# Patient Record
Sex: Female | Born: 1945
Health system: Southern US, Community
[De-identification: ages and names within clinical notes are randomized; demographics above are authoritative.]

## PROBLEM LIST (undated history)

## (undated) DIAGNOSIS — E785 Hyperlipidemia, unspecified: Secondary | ICD-10-CM

## (undated) DIAGNOSIS — I2510 Atherosclerotic heart disease of native coronary artery without angina pectoris: Secondary | ICD-10-CM

## (undated) DIAGNOSIS — M810 Age-related osteoporosis without current pathological fracture: Secondary | ICD-10-CM

## (undated) DIAGNOSIS — Z950 Presence of cardiac pacemaker: Secondary | ICD-10-CM

## (undated) DIAGNOSIS — H269 Unspecified cataract: Secondary | ICD-10-CM

## (undated) DIAGNOSIS — T7840XA Allergy, unspecified, initial encounter: Secondary | ICD-10-CM

## (undated) DIAGNOSIS — I10 Essential (primary) hypertension: Secondary | ICD-10-CM

## (undated) DIAGNOSIS — I442 Atrioventricular block, complete: Secondary | ICD-10-CM

## (undated) DIAGNOSIS — R7309 Other abnormal glucose: Secondary | ICD-10-CM

## (undated) HISTORY — DX: Allergy, unspecified, initial encounter: T78.40XA

## (undated) HISTORY — DX: Unspecified cataract: H26.9

## (undated) HISTORY — DX: Age-related osteoporosis without current pathological fracture: M81.0

---
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
## 1998-03-12 DIAGNOSIS — I10 Essential (primary) hypertension: Secondary | ICD-10-CM

## 1998-03-12 HISTORY — DX: Essential (primary) hypertension: I10

## 2007-03-13 HISTORY — PX: OTHER SURGICAL HISTORY: SHX169

## 2008-03-12 DIAGNOSIS — E785 Hyperlipidemia, unspecified: Secondary | ICD-10-CM

## 2008-03-12 HISTORY — DX: Hyperlipidemia, unspecified: E78.5

## 2009-03-12 HISTORY — PX: CHOLECYSTECTOMY: SHX55

## 2014-03-12 HISTORY — PX: ROTATOR CUFF REPAIR: SHX139

## 2017-02-14 LAB — HM COLONOSCOPY

## 2018-03-12 HISTORY — PX: PACEMAKER IMPLANT: EP1218

## 2018-05-11 HISTORY — PX: CORONARY ANGIOPLASTY WITH STENT PLACEMENT: SHX49

## 2018-10-22 ENCOUNTER — Encounter: Payer: Self-pay | Admitting: Internal Medicine

## 2018-10-22 LAB — REMOTE PACEMAKER DEVICE

## 2018-10-27 ENCOUNTER — Telehealth: Payer: Self-pay

## 2018-10-27 NOTE — Telephone Encounter (Signed)
NOTES ON FILE FROM DR Ronn Melena [phone], SENT REFERRAL TO SCHEDULING

## 2018-11-18 ENCOUNTER — Encounter: Payer: Self-pay | Admitting: Internal Medicine

## 2018-11-18 ENCOUNTER — Other Ambulatory Visit: Payer: Self-pay

## 2018-11-18 ENCOUNTER — Ambulatory Visit (INDEPENDENT_AMBULATORY_CARE_PROVIDER_SITE_OTHER): Payer: Medicare Other | Admitting: Internal Medicine

## 2018-11-18 VITALS — BP 136/74 | HR 76 | Temp 97.4°F | Resp 16 | Ht 61.5 in | Wt 157.4 lb

## 2018-11-18 DIAGNOSIS — R7309 Other abnormal glucose: Secondary | ICD-10-CM

## 2018-11-18 DIAGNOSIS — I10 Essential (primary) hypertension: Secondary | ICD-10-CM

## 2018-11-18 DIAGNOSIS — Z1211 Encounter for screening for malignant neoplasm of colon: Secondary | ICD-10-CM

## 2018-11-18 DIAGNOSIS — I442 Atrioventricular block, complete: Secondary | ICD-10-CM

## 2018-11-18 DIAGNOSIS — E559 Vitamin D deficiency, unspecified: Secondary | ICD-10-CM | POA: Insufficient documentation

## 2018-11-18 DIAGNOSIS — Z0001 Encounter for general adult medical examination with abnormal findings: Secondary | ICD-10-CM

## 2018-11-18 DIAGNOSIS — I2510 Atherosclerotic heart disease of native coronary artery without angina pectoris: Secondary | ICD-10-CM

## 2018-11-18 DIAGNOSIS — Z79899 Other long term (current) drug therapy: Secondary | ICD-10-CM

## 2018-11-18 DIAGNOSIS — Z950 Presence of cardiac pacemaker: Secondary | ICD-10-CM

## 2018-11-18 DIAGNOSIS — E782 Mixed hyperlipidemia: Secondary | ICD-10-CM

## 2018-11-18 DIAGNOSIS — Z136 Encounter for screening for cardiovascular disorders: Secondary | ICD-10-CM | POA: Diagnosis not present

## 2018-11-18 HISTORY — DX: Other abnormal glucose: R73.09

## 2018-11-18 HISTORY — DX: Atrioventricular block, complete: I44.2

## 2018-11-18 HISTORY — DX: Atherosclerotic heart disease of native coronary artery without angina pectoris: I25.10

## 2018-11-18 HISTORY — DX: Presence of cardiac pacemaker: Z95.0

## 2018-11-18 HISTORY — DX: Essential (primary) hypertension: I10

## 2018-11-18 NOTE — Patient Instructions (Signed)

## 2018-11-18 NOTE — Progress Notes (Signed)
Annual Screening/Preventative Visit & Comprehensive Evaluation &  Examination     This very nice 73 y.o. MWF presents as a new patient to establish care after moving to [place] from [place].   Patient relates hx/o HTN,CHB/PPM and  ASCAD      HTN predates circa 2000 and is controlled on Amlodipine.  Today's BP is at goal - 136/74.  In Jan 2020, she developed exertional dyspnea and went to an ER ultimately having a PPM implanted. In follow-up, she had a (+) Nuclear stress test and was cath'd and had a stent implanted (Mar 2020) and has done well since w/o any cardiac symptoms as chest pain, palpitations, shortness of breath, dizziness or ankle swelling. She's on dual antiplatelet Tx with LDbASA / Plavix since her stent.  She's scheduled to see Dr Rayann Heman for her pPPM f/u.     Patient's hyperlipidemia is controlled with diet and she relates that she was started on Rosuvastatin about 2010. She reports last lid labs were monitored about a year ago.      Patient denies k/o elevated glucoses  and patient denies reactive hypoglycemic symptoms, visual blurring, diabetic polys or paresthesias.     Finally, patient has no k/o Vit D being checked & is on minimal supplements (600 iu) and is anticipated to be deficient of Vitamin D Deficiency.  Current Outpatient Medications on File Prior to Visit  Medication Sig  . amLODipine (NORVASC) 5 MG tablet Take 5 mg by mouth daily.  Marland Kitchen aspirin EC 81 MG tablet Take 81 mg by mouth daily.  . Calcium Carb-Cholecalciferol (CALTRATE 600+D3 PO) Take 1 tablet by mouth 2 (two) times daily.  . clopidogrel (PLAVIX) 75 MG tablet Take 75 mg by mouth daily.  . famotidine (PEPCID) 20 MG tablet Take 20 mg by mouth daily.  . Lutein 20 MG CAPS Take 1 capsule by mouth daily.  . metoprolol succinate (TOPROL-XL) 25 MG 24 hr tablet Take 25 mg by mouth daily.  . rosuvastatin (CRESTOR) 5 MG tablet Take 5 mg by mouth daily.   No current facility-administered medications on file  prior to visit.    Allergies  Allergen Reactions  . Penicillins    Health Maintenance  Topic Date Due  . Hepatitis C Screening  May 11, 1945  . TETANUS/TDAP  05/24/1962  . COLONOSCOPY  05/23/1993  . DEXA SCAN  05/23/2008  . PNA vac Low Risk Adult (1 of 2 - PCV13) 05/23/2008  . INFLUENZA VACCINE  10/11/2018   Immunization History  Administered Date(s) Administered  . Pneumococcal Conjugate-13 04/12/2018  . Zoster Recombinat (Shingrix) 02/09/2018, 04/29/2018   Last Colon - 02/2017 in [place] and no f/u recc due to age.  Last MGM - plans to schedule  Social History   Tobacco Use  . Smoking status: Never Smoker  . Smokeless tobacco: Never Used  Substance Use Topics  . Alcohol Use    Alcohol/week: 3.0 standard drinks    Types: 3 Glasses of wine per week    Frequency: Never  . Drug use: Never    ROS Constitutional: Denies fever, chills, weight loss/gain, headaches, insomnia,  night sweats, and change in appetite. Does c/o fatigue. Eyes: Denies redness, blurred vision, diplopia, discharge, itchy, watery eyes.  ENT: Denies discharge, congestion, post nasal drip, epistaxis, sore throat, earache, hearing loss, dental pain, Tinnitus, Vertigo, Sinus pain, snoring.  Cardio: Denies chest pain, palpitations, irregular heartbeat, syncope, dyspnea, diaphoresis, orthopnea, PND, claudication, edema Respiratory: denies cough, dyspnea, DOE, pleurisy, hoarseness, laryngitis, wheezing.  Gastrointestinal: Denies  dysphagia, heartburn, reflux, water brash, pain, cramps, nausea, vomiting, bloating, diarrhea, constipation, hematemesis, melena, hematochezia, jaundice, hemorrhoids Genitourinary: Denies dysuria, frequency, urgency, nocturia, hesitancy, discharge, hematuria, flank pain Breast: Breast lumps, nipple discharge, bleeding.  Musculoskeletal: Denies arthralgia, myalgia, stiffness, Jt. Swelling, pain, limp, and strain/sprain. Denies falls. Skin: Denies puritis, rash, hives, warts, acne, eczema,  changing in skin lesion Neuro: No weakness, tremor, incoordination, spasms, paresthesia, pain Psychiatric: Denies confusion, memory loss, sensory loss. Denies Depression. Endocrine: Denies change in weight, skin, hair change, nocturia, and paresthesia, diabetic polys, visual blurring, hyper / hypo glycemic episodes.  Heme/Lymph: No excessive bleeding, bruising, enlarged lymph nodes.  Physical Exam  BP 136/74   Pulse 76   Temp (!) 97.4 F (36.3 C)   Resp 16   Ht 5' 1.5" (1.562 m)   Wt 157 lb 6.4 oz (71.4 kg)   BMI 29.26 kg/m   General Appearance: Over nourished, well groomed and in no apparent distress.  Eyes: PERRLA, EOMs, conjunctiva no swelling or erythema, normal fundi and vessels. Sinuses: No frontal/maxillary tenderness ENT/Mouth: EACs patent / TMs  nl. Nares clear without erythema, swelling, mucoid exudates. Oral hygiene is good. No erythema, swelling, or exudate. Tongue normal, non-obstructing. Tonsils not swollen or erythematous. Hearing normal.  Neck: Supple, thyroid not palpable. No bruits, nodes or JVD. Respiratory: Respiratory effort normal.  BS equal and clear bilateral without rales, rhonci, wheezing or stridor. Cardio: Heart sounds are normal with regular rate and rhythm and no murmurs, rubs or gallops. Peripheral pulses are normal and equal bilaterally without edema. No aortic or femoral bruits. Chest: symmetric with normal excursions and percussion. Breasts: Symmetric, without lumps, nipple discharge, retractions, or fibrocystic changes.  Abdomen: Flat, soft with bowel sounds active. Nontender, no guarding, rebound, hernias, masses, or organomegaly.  Lymphatics: Non tender without lymphadenopathy.  Musculoskeletal: Full ROM all peripheral extremities, joint stability, 5/5 strength, and normal gait. Skin: Warm and dry without rashes, lesions, cyanosis, clubbing or  ecchymosis.  Neuro: Cranial nerves intact, reflexes equal bilaterally. Normal muscle tone, no cerebellar  symptoms. Sensation intact.  Pysch: Alert and oriented X 3, normal affect, Insight and Judgment appropriate.   Assessment and Plan  1. Annual Preventative Screening Examination  2. Essential hypertension  - EKG 12-Lead - 100% Paced rhythm  - Urinalysis, Routine w reflex microscopic - Microalbumin / creatinine urine ratio - CBC with Differential/Platelet - COMPLETE METABOLIC PANEL WITH GFR - Magnesium - TSH - COMPLETE METABOLIC PANEL WITH GFR  3. Hyperlipidemia, mixed  - EKG 12-Lead - Lipid panel - TSH  4. Abnormal glucose  - EKG 12-Lead - Hemoglobin A1c - Insulin, random  5. Vitamin D deficiency  - VITAMIN D 25 Hydroxyl  6. Screening for colorectal cancer  - POC Hemoccult Bld/Stl  7. Screening for ischemic heart disease  - EKG 12-Lead - Lipid panel  8. Complete heart block (HCC)  9. Cardiac pacemaker  10. Medication management  - Urinalysis, Routine w reflex microscopic - Microalbumin / creatinine urine ratio - CBC with Differential/Platelet - COMPLETE METABOLIC PANEL WITH GFR - Magnesium - Lipid panel - TSH - Hemoglobin A1c - Insulin, random - VITAMIN D 25 Hydroxy (Vit-D Deficiency, Fractures) - COMPLETE METABOLIC PANEL WITH GFR  11. Arteriosclerotic heart disease (ASHD)           Patient was counseled in prudent diet to achieve/maintain BMI less than 25 for weight control, BP monitoring, regular exercise and medications. Discussed med's effects and SE's. Screening labs and tests as requested with regular follow-up as  recommended. Over 40 minutes of exam, counseling, chart review and high complex critical decision making was performed.   Kirtland Bouchard, MD

## 2018-11-19 LAB — COMPLETE METABOLIC PANEL WITH GFR
AG Ratio: 1.7 (calc) (ref 1.0–2.5)
ALT: 19 U/L (ref 6–29)
AST: 19 U/L (ref 10–35)
Albumin: 4.2 g/dL (ref 3.6–5.1)
Alkaline phosphatase (APISO): 57 U/L (ref 37–153)
BUN: 12 mg/dL (ref 7–25)
CO2: 27 mmol/L (ref 20–32)
Calcium: 9.1 mg/dL (ref 8.6–10.4)
Chloride: 106 mmol/L (ref 98–110)
Creat: 0.86 mg/dL (ref 0.60–0.93)
GFR, Est African American: 77 mL/min/{1.73_m2} (ref >=60)
GFR, Est Non African American: 66 mL/min/{1.73_m2} (ref >=60)
Globulin: 2.5 g/dL (calc) (ref 1.9–3.7)
Glucose, Bld: 112 mg/dL — ABNORMAL HIGH (ref 65–99)
Potassium: 4 mmol/L (ref 3.5–5.3)
Sodium: 142 mmol/L (ref 135–146)
Total Bilirubin: 0.5 mg/dL (ref 0.2–1.2)
Total Protein: 6.7 g/dL (ref 6.1–8.1)

## 2018-11-19 LAB — URINALYSIS, ROUTINE W REFLEX MICROSCOPIC
Bilirubin Urine: NEGATIVE
Glucose, UA: NEGATIVE
Hgb urine dipstick: NEGATIVE
Ketones, ur: NEGATIVE
Leukocytes,Ua: NEGATIVE
Nitrite: NEGATIVE
Protein, ur: NEGATIVE
Specific Gravity, Urine: 1.009 (ref 1.001–1.03)
pH: <=5 (ref 5.0–8.0)

## 2018-11-19 LAB — CBC WITH DIFFERENTIAL/PLATELET
Absolute Monocytes: 749 cells/uL (ref 200–950)
Basophils Absolute: 72 cells/uL (ref 0–200)
Basophils Relative: 1.5 %
Eosinophils Absolute: 355 cells/uL (ref 15–500)
Eosinophils Relative: 7.4 %
HCT: 40.3 % (ref 35.0–45.0)
Hemoglobin: 13.3 g/dL (ref 11.7–15.5)
Lymphs Abs: 1162 cells/uL (ref 850–3900)
MCH: 29.8 pg (ref 27.0–33.0)
MCHC: 33 g/dL (ref 32.0–36.0)
MCV: 90.4 fL (ref 80.0–100.0)
MPV: 10 fL (ref 7.5–12.5)
Monocytes Relative: 15.6 %
Neutro Abs: 2462 cells/uL (ref 1500–7800)
Neutrophils Relative %: 51.3 %
Platelets: 272 10*3/uL (ref 140–400)
RBC: 4.46 10*6/uL (ref 3.80–5.10)
RDW: 12.8 % (ref 11.0–15.0)
Total Lymphocyte: 24.2 %
WBC: 4.8 10*3/uL (ref 3.8–10.8)

## 2018-11-19 LAB — LIPID PANEL
Cholesterol: 184 mg/dL (ref <200)
HDL: 64 mg/dL (ref >=50)
LDL Cholesterol (Calc): 92 mg/dL (calc)
Non-HDL Cholesterol (Calc): 120 mg/dL (calc) (ref <130)
Total CHOL/HDL Ratio: 2.9 (calc) (ref <5.0)
Triglycerides: 188 mg/dL — ABNORMAL HIGH (ref <150)

## 2018-11-19 LAB — MICROALBUMIN / CREATININE URINE RATIO
Creatinine, Urine: 65 mg/dL (ref 20–275)
Microalb Creat Ratio: 6 mcg/mg creat (ref <30)
Microalb, Ur: 0.4 mg/dL

## 2018-11-19 LAB — HEMOGLOBIN A1C
Hgb A1c MFr Bld: 5.4 % of total Hgb (ref <5.7)
Mean Plasma Glucose: 108 (calc)
eAG (mmol/L): 6 (calc)

## 2018-11-19 LAB — INSULIN, RANDOM: Insulin: 19.1 u[IU]/mL

## 2018-11-19 LAB — VITAMIN D 25 HYDROXY (VIT D DEFICIENCY, FRACTURES): Vit D, 25-Hydroxy: 32 ng/mL (ref 30–100)

## 2018-11-19 LAB — MAGNESIUM: Magnesium: 2.1 mg/dL (ref 1.5–2.5)

## 2018-11-19 LAB — TSH: TSH: 2.66 mIU/L (ref 0.40–4.50)

## 2018-11-20 ENCOUNTER — Encounter: Payer: Self-pay | Admitting: *Deleted

## 2018-11-20 ENCOUNTER — Other Ambulatory Visit: Payer: Self-pay | Admitting: Internal Medicine

## 2018-11-20 ENCOUNTER — Telehealth: Payer: Self-pay

## 2018-11-20 DIAGNOSIS — Z1231 Encounter for screening mammogram for malignant neoplasm of breast: Secondary | ICD-10-CM

## 2018-11-20 NOTE — Telephone Encounter (Signed)
Spoke with pt regarding her appt on 11/24/18. Pt stated she will set up her MyChart. Pt was advise to call me if she has any questions.

## 2018-11-24 ENCOUNTER — Encounter: Payer: Self-pay | Admitting: Internal Medicine

## 2018-11-24 ENCOUNTER — Telehealth (INDEPENDENT_AMBULATORY_CARE_PROVIDER_SITE_OTHER): Payer: Medicare Other | Admitting: Internal Medicine

## 2018-11-24 VITALS — Ht 61.0 in | Wt 157.0 lb

## 2018-11-24 DIAGNOSIS — I442 Atrioventricular block, complete: Secondary | ICD-10-CM | POA: Diagnosis not present

## 2018-11-24 DIAGNOSIS — I10 Essential (primary) hypertension: Secondary | ICD-10-CM | POA: Diagnosis not present

## 2018-11-24 DIAGNOSIS — I25118 Atherosclerotic heart disease of native coronary artery with other forms of angina pectoris: Secondary | ICD-10-CM | POA: Diagnosis not present

## 2018-11-24 DIAGNOSIS — I2510 Atherosclerotic heart disease of native coronary artery without angina pectoris: Secondary | ICD-10-CM | POA: Diagnosis not present

## 2018-11-24 NOTE — Progress Notes (Signed)
Electrophysiology TeleHealth Note  Due to national recommendations of social distancing due to [place] 19, an audio telehealth visit is felt to be most appropriate for this patient at this time.  Verbal consent was obtained by me for the telehealth visit today.  The patient does not have capability for a virtual visit.  A phone visit is therefore required today.   Date:  11/24/2018   ID:  Kristy [name], DOB 04-25-1945, MRN [id number]  Location: patient's home  Provider location:  [facility]  Evaluation Performed: Follow-up visit  PCP:  Unk Breth, MD  Dr Hassell Done [facility])   Electrophysiologist:  Dr Rayann Heman  Chief Complaint:  palpitations  History of Present Illness:    Kristy [name] is a 73 y.o. female who presents via telehealth conferencing today.  She presents to establish care after moving from [place] to [place]. She moved here for a better cost of living. She reports having abrupt SOB on New Years day 2020 and was found to have bradycardia as the cause. s She was evaluated in [place] where she lives and underwent pacemaker implantation for complete heart block.  She has done well since ppm implantation.   She had an abnormal stress test in March 2020 and subsequently underwent PVI 05/16/2018.   She is active.   Today, she denies symptoms of palpitations, chest pain, shortness of breath,  lower extremity edema, dizziness, presyncope, or syncope.  The patient is otherwise without complaint today.  The patient denies symptoms of fevers, chills, cough, or new SOB worrisome for COVID 19.  Past Medical History:  Diagnosis Date  . Abnormal glucose 11/18/2018  . Allergy    penicillin  . Arteriosclerotic heart disease (ASHD) 11/18/2018  . Cardiac pacemaker 11/18/2018  . Complete heart block ([place]) 11/18/2018  . Essential hypertension 11/18/2018  . Hyperlipidemia 2010  . Hypertension 2000  . Osteoporosis     Past Surgical History:  Procedure Laterality Date  . CHOLECYSTECTOMY  2011  . CORONARY  ANGIOPLASTY WITH STENT PLACEMENT  05/2018  . mohl  2009   face  . PACEMAKER IMPLANT  03/2018  . ROTATOR CUFF REPAIR Right 2016    Current Outpatient Medications  Medication Sig Dispense Refill  . amLODipine (NORVASC) 5 MG tablet Take 5 mg by mouth daily.    . Ascorbic Acid (VITAMIN C PO) Take 1,000 mg by mouth daily.    Marland Kitchen aspirin EC 81 MG tablet Take 81 mg by mouth daily.    . Calcium Carb-Cholecalciferol (CALTRATE 600+D3 PO) Take 1 tablet by mouth 2 (two) times daily.    . clopidogrel (PLAVIX) 75 MG tablet Take 75 mg by mouth daily.    . famotidine (PEPCID) 20 MG tablet Take 20 mg by mouth daily.    . Lutein 20 MG CAPS Take 1 capsule by mouth daily.    . metoprolol succinate (TOPROL-XL) 25 MG 24 hr tablet Take 25 mg by mouth daily.    . rosuvastatin (CRESTOR) 5 MG tablet Take 5 mg by mouth daily.     No current facility-administered medications for this visit.     Allergies:   Penicillins   Social History:  The patient  reports that she has never smoked. She has never used smokeless tobacco. She reports that she does not use drugs.   Family History:  The patient's family history includes Colon cancer in her mother; Diabetes in her father; Hypertension in her brother.   ROS:  Please see the history of present illness.  All other systems are personally reviewed and negative.    Exam:    Vital Signs:  Ht 5\' 1"  (1.549 m)   Wt 157 lb (71.2 kg)   BMI 29.66 kg/m   Well sounding, alert and conversant  Labs/Other Tests and Data Reviewed:    Recent Labs: 11/18/2018: ALT 19; BUN 12; Creat 0.86; Hemoglobin 13.3; Magnesium 2.1; Platelets 272; Potassium 4.0; Sodium 142; TSH 2.66   Wt Readings from Last 3 Encounters:  11/24/18 157 lb (71.2 kg)  11/18/18 157 lb 6.4 oz (71.4 kg)      ASSESSMENT & PLAN:    1.  Complete heart block S/p PPM 03/2018 I will have remotes sent to my office for review  2. CAD No ischemic symptoms  3. HTN Stable No change required today  4. HL  Stable No change required today  Follow-up:  We will establish remotes in our office Return to see EP PA in 4 months and then annually   Patient Risk:  after full review of this patients clinical status, I feel that they are at moderate risk at this time.  Today, I have spent 15 minutes with the patient with telehealth technology discussing arrhythmia management .    Army Fossa, MD  11/24/2018 11:09 AM     [facility] [address] [place] [address] [phone] (office) [phone] (fax)

## 2018-11-25 NOTE — Telephone Encounter (Signed)
I spoke with the pt and asked her for her ppm model and serial number to get her enrolled in [facility]. She gave me the information for her ppm. I requested in [facility] for her to be transferred to [place]. I called the facility to have them release her but did not get an answer. I did leave a message for them to call me back.

## 2018-11-26 NOTE — Telephone Encounter (Signed)
I called the cardiologist in [place], they stated they are going to call her to verify that she wants [place] to follow her. Once they have her verbal approval they will release her in [facility].

## 2018-11-27 NOTE — Telephone Encounter (Signed)
Pt is enrolled in [facility] and Scheduled for a transmission

## 2018-11-28 ENCOUNTER — Ambulatory Visit (INDEPENDENT_AMBULATORY_CARE_PROVIDER_SITE_OTHER): Payer: Medicare Other | Admitting: *Deleted

## 2018-11-28 DIAGNOSIS — I442 Atrioventricular block, complete: Secondary | ICD-10-CM | POA: Diagnosis not present

## 2018-11-28 LAB — CUP PACEART REMOTE DEVICE CHECK
Battery Remaining Longevity: 143 mo
Battery Voltage: 3.05 V
Brady Statistic AP VP Percent: 3.81 %
Brady Statistic AP VS Percent: 0 %
Brady Statistic AS VP Percent: 96.17 %
Brady Statistic AS VS Percent: 0.02 %
Brady Statistic RA Percent Paced: 3.81 %
Brady Statistic RV Percent Paced: 99.98 %
Date Time Interrogation Session: 20200918050723
Implantable Lead Implant Date: 20200102
Implantable Lead Implant Date: 20200102
Implantable Lead Location: 753859
Implantable Lead Location: 753860
Implantable Lead Model: 4076
Implantable Lead Model: 4076
Lead Channel Impedance Value: 323 Ohm
Lead Channel Impedance Value: 380 Ohm
Lead Channel Impedance Value: 437 Ohm
Lead Channel Impedance Value: 475 Ohm
Lead Channel Pacing Threshold Amplitude: 0.5 V
Lead Channel Pacing Threshold Amplitude: 0.75 V
Lead Channel Pacing Threshold Pulse Width: 0.4 ms
Lead Channel Pacing Threshold Pulse Width: 0.4 ms
Lead Channel Sensing Intrinsic Amplitude: 12 mV
Lead Channel Sensing Intrinsic Amplitude: 12 mV
Lead Channel Sensing Intrinsic Amplitude: 2.875 mV
Lead Channel Sensing Intrinsic Amplitude: 2.875 mV
Lead Channel Setting Pacing Amplitude: 1.5 V
Lead Channel Setting Pacing Amplitude: 2 V
Lead Channel Setting Pacing Pulse Width: 0.4 ms
Lead Channel Setting Sensing Sensitivity: 2 mV

## 2018-12-01 ENCOUNTER — Encounter: Payer: Self-pay | Admitting: Cardiology

## 2018-12-01 NOTE — Progress Notes (Signed)
Remote pacemaker transmission.   

## 2018-12-10 NOTE — Progress Notes (Signed)
     History of Present Illness:           This very nice 73 y.o. MWF with  HTN,CHB/PPM and  ASCAD who presents with c/o of recent fall resulting from mis stepping and losing her balance 12 days previous. She had old ecchymoses of both arms and legs. She is concerned re: some persistent redness & swelling of her Rt leg.   Current Outpatient Medications  .  amLODipine (NORVASC) 5 MG tablet, Take 5 mg by mouth daily. .  metoprolol succinate (TOPROL-XL) 25 MG 24 hr tablet, Take 25 mg by mouth daily. .  rosuvastatin (CRESTOR) 5 MG tablet, Take 5 mg by mouth daily. Marland Kitchen  aspirin EC 81 MG tablet, Take 81 mg by mouth daily.  .  clopidogrel (PLAVIX) 75 MG tablet, Take 75 mg by mouth daily. .  Ascorbic Acid (VITAMIN C PO), Take 1,000 mg by mouth daily. .  Calcium Carb-Cholecalciferol (CALTRATE 600+D3 PO), Take 1 tablet by mouth 2 (two) times daily. .  famotidine (PEPCID) 20 MG tablet, Take 20 mg by mouth daily. .  Lutein 20 MG CAPS, Take 1 capsule by mouth daily.  Allergies  Allergen Reactions  . Penicillins    Problem list She has Arteriosclerotic heart disease (ASHD); Cardiac pacemaker; Complete heart block ([place]); Vitamin D deficiency; Abnormal glucose; and Essential hypertension on their problem list.   Observations/Objective:  BP (!) 162/84   Pulse 64   Temp (!) 97.5 F (36.4 C)   Resp 16   Ht 5' 1.5" (1.562 m)   Wt 160 lb (72.6 kg)   BMI 29.74 kg/m   HEENT - WNL. Neck - supple.  Chest - Clear equal BS. Cor - Nl HS. RRR w/o sig MGR. PP 1(+). No edema. MS- FROM of all extremities w/o deformities.  Gait Nl. Neuro -  Nl w/o focal abnormalities. Skin - There is an area of warm tender STS over the Rt mid shin which appears cellulitic. No calf tenderness to compression  Assessment and Plan:   1. Cellulitis of right lower leg  - doxycycline (VIBRAMYCIN) 100 MG capsule; Take 1 capsule 2 x /day with food for 5 days,  then 1 x /day with food for 10 days  Dispense: 20 capsule; Refill:  1  Follow Up Instructions:        Discussed meds & SE's & advised to return of redness & swelling of Rt leg not resolve. I discussed the assessment and treatment plan with the patient. The patient was provided an opportunity to ask questions and all were answered. The patient agreed with the plan and demonstrated an understanding of the instructions.      The patient was advised to call back or seek an in-person evaluation if the symptoms worsen or if the condition fails to improve as anticipated.   Kirtland Bouchard, MD

## 2018-12-11 ENCOUNTER — Ambulatory Visit (INDEPENDENT_AMBULATORY_CARE_PROVIDER_SITE_OTHER): Payer: Medicare Other | Admitting: Internal Medicine

## 2018-12-11 ENCOUNTER — Other Ambulatory Visit: Payer: Self-pay

## 2018-12-11 VITALS — BP 162/84 | HR 64 | Temp 97.5°F | Resp 16 | Ht 61.5 in | Wt 160.0 lb

## 2018-12-11 DIAGNOSIS — L03115 Cellulitis of right lower limb: Secondary | ICD-10-CM

## 2018-12-11 DIAGNOSIS — I2510 Atherosclerotic heart disease of native coronary artery without angina pectoris: Secondary | ICD-10-CM | POA: Diagnosis not present

## 2018-12-11 MED ORDER — DOXYCYCLINE HYCLATE 100 MG PO CAPS: ORAL_CAPSULE | ORAL | 1 refills | Status: DC

## 2018-12-12 ENCOUNTER — Encounter: Payer: Self-pay | Admitting: Internal Medicine

## 2018-12-30 ENCOUNTER — Ambulatory Visit (INDEPENDENT_AMBULATORY_CARE_PROVIDER_SITE_OTHER): Payer: Medicare Other | Admitting: Internal Medicine

## 2018-12-30 ENCOUNTER — Other Ambulatory Visit: Payer: Self-pay

## 2018-12-30 VITALS — BP 138/84 | HR 72 | Temp 97.6°F | Resp 16 | Ht 61.5 in | Wt 159.8 lb

## 2018-12-30 DIAGNOSIS — I2510 Atherosclerotic heart disease of native coronary artery without angina pectoris: Secondary | ICD-10-CM | POA: Diagnosis not present

## 2018-12-30 DIAGNOSIS — L03115 Cellulitis of right lower limb: Secondary | ICD-10-CM | POA: Diagnosis not present

## 2018-12-31 ENCOUNTER — Ambulatory Visit: Payer: Medicare Other | Admitting: Internal Medicine

## 2019-01-03 ENCOUNTER — Encounter: Payer: Self-pay | Admitting: Internal Medicine

## 2019-01-03 NOTE — Progress Notes (Signed)
   Subjective:    Patient ID: Kristy [name], female    DOB: 12/07/1945, 73 y.o.   MRN: [id number]  HPI  This very nice 73 yo MWF was sen 12/11/2018 for a RLE cellulitis consequent of a fall & contusion and is completing a refill of Doxycycline. She's concerned that the tender swelling of her lower anterior shin has not fully resolved.  Medication Sig  . amLODipine  5 MG tablet Take  daily.  Marland Kitchen VITAMIN C  Take 1,000 mg  daily.  Marland Kitchen aspirin EC 81 MG  Take  daily.  Marland Kitchen CALTRATE 600+D3 PO Take 1 tablet by mouth 2 (two) times daily.  Marland Kitchen PLAVIX 75 MG  Take 75 mg by mouth daily.  Marland Kitchen doxycycline  100 MG caps Take 1 cap 2 x /day-5 days,  then 1 x /day-10days  . famotidine20 MG Take  daily  . Lutein 20 MG  Take  daily  . metoprolol succ-XL 25 MG  Take  daily  . rosuvastatin  5 MG  Take  daily   Past Medical History:  Diagnosis Date  . Abnormal glucose 11/18/2018  . Allergy    penicillin  . Arteriosclerotic heart disease (ASHD) 11/18/2018   s/p PCI 05/2018  . Cardiac pacemaker 11/18/2018  . Complete heart block ([place]) 11/18/2018  . Essential hypertension 11/18/2018  . Hyperlipidemia 2010  . Hypertension 2000  . Osteoporosis    Review of Systems 10 point systems review negative except as above.    Objective:   Physical Exam  BP 138/84   Pulse 72   Temp 97.6 F (36.4 C)   Resp 16   Ht 5' 1.5" (1.562 m)   Wt 159 lb 12.8 oz (72.5 kg)   BMI 29.70 kg/m   HEENT - WNL. Neck - supple.  Chest - Clear equal BS. Cor - Nl HS. RRR w/o sig MGR. PP 1(+). No edema. MS- FROM w/o deformities.  Gait Nl. Neuro -  Nl w/o focal abnormalities. Skin- the previously noted area of the Rt lower shin is appreciably smaller in size & still has some warmth, but no signs of lymphangitis    Assessment & Plan:   1. Cellulitis of right lower leg  - advised continue & complete the 30 day course of Doxycycline (10 more cays to go) & call for alternate Abx as Cephalexin.

## 2019-01-05 ENCOUNTER — Ambulatory Visit
Admission: RE | Admit: 2019-01-05 | Discharge: 2019-01-05 | Disposition: A | Discharge status: Home / Self Care | Payer: Medicare Other | Source: Ambulatory Visit | Attending: Internal Medicine | Admitting: Internal Medicine

## 2019-01-05 ENCOUNTER — Other Ambulatory Visit: Payer: Self-pay

## 2019-01-05 DIAGNOSIS — Z1231 Encounter for screening mammogram for malignant neoplasm of breast: Secondary | ICD-10-CM

## 2019-02-19 NOTE — Progress Notes (Addendum)
FOLLOW UP  Assessment and Plan:   Complete heart block/ S/p pacemaker Now follows with Dr. Rayann Heman Doing well s/p pacemaker Monitor   Arteriosclerotic heart disease S/p stent 05/2018; continues with DAPT - plavix, ASA - continue for now per cardiology Denies sx of excess bleeding; continue to monitor Control blood pressure, cholesterol, glucose, encourage lifestyle/exercise   Hypertension Well controlled with current medications  Monitor blood pressure at home; patient to call if consistently greater than 130/80 Continue DASH diet.   Reminder to go to the ER if any CP, SOB, nausea, dizziness, severe HA, changes vision/speech, left arm numbness and tingling and jaw pain.  Cholesterol Continue statin therapy for LDL  LDL goal of <70 reviewed; she would prefer to avoid increasing med dose, wants to work on lifestyle Continue low cholesterol diet and exercise.  Check lipid panel.   Other abnormal glucse Recent A1Cs at goal Discussed diet/exercise, weight management  Defer A1C; check CMP for serum glucose, monitor weight trends  Overweight - BMI 30 Long discussion about weight loss, diet, and exercise Recommended diet heavy in fruits and veggies and low in animal meats, cheeses, and dairy products, appropriate calorie intake Discussed ideal weight for height  and initial weight goal (<150 lb) Patient will work on eating out less and walking once moves to new home Will follow up in 3 months  Vitamin D Def Below goal at last visit; newly taking 5000 IU daily  continue to recommend supplementation to maintain goal of 60-100 Defer Vit D level, check at follow up  Continue diet and meds as discussed. Further disposition pending results of labs. Discussed med's effects and SE's.   Over 30 minutes of exam, counseling, chart review, and critical decision making was performed.   Future Appointments  Date Time Provider [place]  02/27/2019  7:10 AM [facility] DEVICE REMOTES  [facility]  05/26/2019 10:30 AM Unk Noblett, MD [facility] None  05/29/2019  7:10 AM [facility] DEVICE REMOTES [facility]  08/28/2019  7:10 AM [facility] DEVICE REMOTES [facility]  11/24/2019 11:00 AM Unk Mcguffee, MD [facility] None  11/27/2019  7:10 AM [facility] DEVICE REMOTES [facility]  02/26/2020  7:10 AM [facility] DEVICE REMOTES [facility]    ----------------------------------------------------------------------------------------------------------------------  HPI 73 y.o. female  presents for 3 month follow up on hypertension, cholesterol, glucose management, weight and vitamin D deficiency.   She is new to the area, moved from [place], daughter in law comes here.   She will be following with [facility] Dr. Martin Majestic due to history of skin cancer, had moh's of left temple remotely. No recent concerns.   She recently completed course of doxycycline for wound/cellulitis of shin after a mechanical fall walking a dog on concrete steps and is doing much better.   BMI is Body mass index is 30.11 kg/m., she has not been working on diet and exercise, living with Son and family and limited other than walking dog, hasn't been able to cook, eating out a lot, will do better once they can move into the new house.  Wt Readings from Last 3 Encounters:  02/23/19 162 lb (73.5 kg)  12/30/18 159 lb 12.8 oz (72.5 kg)  12/11/18 160 lb (72.6 kg)   Hx of complete heart block in 03/2018 s/p pacemaker and continues to do well; She is now established with Dr. Rayann Heman She had a (+) Nuclear stress test early 2020 and underwent cath and had a stent implanted (Mar 2020) and has done well since, continues with  dual antiplatelet therapy (plavix, bASA)  She has BP cuff but in storage at this time, today their BP is BP: 138/80  She does not workout. She denies chest pain, shortness of breath, dizziness.   She is on cholesterol  medication Rosuvastatin 5 mg and denies myalgias. Her LDL cholesterol is at goal. Trigs remain mildly elevated. The cholesterol last visit was:   Lab Results  Component Value Date   CHOL 184 11/18/2018   HDL 64 11/18/2018   LDLCALC 92 11/18/2018   TRIG 188 (H) 11/18/2018   CHOLHDL 2.9 11/18/2018    She has not been working on diet and exercise for hx of abnormal glucose, and denies increased appetite, nausea, paresthesia of the feet, polydipsia, polyuria and visual disturbances. Last A1C in the office was:  Lab Results  Component Value Date   HGBA1C 5.4 11/18/2018    Patient is on Vitamin D supplement,  Lab Results  Component Value Date   VD25OH 32 11/18/2018        Current Medications:  Current Outpatient Medications on File Prior to Visit  Medication Sig  . amLODipine (NORVASC) 5 MG tablet Take 5 mg by mouth daily.  . Ascorbic Acid (VITAMIN C PO) Take 1,000 mg by mouth daily.  Marland Kitchen aspirin EC 81 MG tablet Take 81 mg by mouth daily.  . Calcium Carb-Cholecalciferol (CALTRATE 600+D3 PO) Take 1 tablet by mouth 2 (two) times daily.  . clopidogrel (PLAVIX) 75 MG tablet Take 75 mg by mouth daily.  . famotidine (PEPCID) 20 MG tablet Take 20 mg by mouth daily.  . Lutein 20 MG CAPS Take 1 capsule by mouth daily.  . metoprolol succinate (TOPROL-XL) 25 MG 24 hr tablet Take 25 mg by mouth daily.  . rosuvastatin (CRESTOR) 5 MG tablet Take 5 mg by mouth daily.  Marland Kitchen doxycycline (VIBRAMYCIN) 100 MG capsule Take 1 capsule 2 x /day with food for 5 days,  then 1 x /day with food for 10 days   No current facility-administered medications on file prior to visit.     Allergies:  Allergies  Allergen Reactions  . Penicillins      Medical History:  Past Medical History:  Diagnosis Date  . Abnormal glucose 11/18/2018  . Allergy    penicillin  . Arteriosclerotic heart disease (ASHD) 11/18/2018   s/p PCI 05/2018  . Cardiac pacemaker 11/18/2018  . Complete heart block ([place]) 11/18/2018  . Essential  hypertension 11/18/2018  . Hyperlipidemia 2010  . Hypertension 2000  . Osteoporosis    Family history- Reviewed and unchanged Social history- Reviewed and unchanged   Review of Systems:  Review of Systems  Constitutional: Negative for malaise/fatigue and weight loss.  HENT: Negative for hearing loss and tinnitus.   Eyes: Negative for blurred vision and double vision.  Respiratory: Negative for cough, shortness of breath and wheezing.   Cardiovascular: Negative for chest pain, palpitations, orthopnea, claudication and leg swelling.  Gastrointestinal: Negative for abdominal pain, blood in stool, constipation (improved on stool softener), diarrhea, heartburn, melena, nausea and vomiting.  Genitourinary: Negative.   Musculoskeletal: Negative for joint pain and myalgias.  Skin: Negative for rash.  Neurological: Negative for dizziness, tingling, sensory change, weakness and headaches.  Endo/Heme/Allergies: Negative for polydipsia.  Psychiatric/Behavioral: Negative.   All other systems reviewed and are negative.   Physical Exam: BP 138/80   Pulse 82   Temp (!) 97.5 F (36.4 C)   Ht 5' 1.5" (1.562 m)   Wt 162 lb (73.5 kg)  SpO2 97%   BMI 30.11 kg/m  Wt Readings from Last 3 Encounters:  02/23/19 162 lb (73.5 kg)  12/30/18 159 lb 12.8 oz (72.5 kg)  12/11/18 160 lb (72.6 kg)   General Appearance: Well nourished, in no apparent distress. Eyes: PERRLA, EOMs, conjunctiva no swelling or erythema Sinuses: No Frontal/maxillary tenderness ENT/Mouth: Ext aud canals clear, TMs without erythema, bulging. No erythema, swelling, or exudate on post pharynx.  Tonsils not swollen or erythematous. Hearing normal.  Neck: Supple, thyroid normal.  Respiratory: Respiratory effort normal, BS equal bilaterally without rales, rhonchi, wheezing or stridor.  Cardio: RRR with no MRGs. Brisk peripheral pulses without edema.  Abdomen: Soft, + BS.  Non tender, no guarding, rebound, hernias,  masses. Lymphatics: Non tender without lymphadenopathy.  Musculoskeletal: Full ROM, 5/5 strength, Normal gait Skin: Warm, dry without rashes, lesions, ecchymosis. Left shin wound appears fully healed.   Neuro: Cranial nerves intact. No cerebellar symptoms.  Psych: Awake and oriented X 3, normal affect, Insight and Judgment appropriate.    Kristy Ribas, Kristy [name] 11:09 AM [name]

## 2019-02-23 ENCOUNTER — Other Ambulatory Visit: Payer: Self-pay

## 2019-02-23 ENCOUNTER — Encounter: Payer: Self-pay | Admitting: Adult Health

## 2019-02-23 ENCOUNTER — Ambulatory Visit (INDEPENDENT_AMBULATORY_CARE_PROVIDER_SITE_OTHER): Payer: Medicare Other | Admitting: Adult Health

## 2019-02-23 VITALS — BP 138/80 | HR 82 | Temp 97.5°F | Ht 61.5 in | Wt 162.0 lb

## 2019-02-23 DIAGNOSIS — E559 Vitamin D deficiency, unspecified: Secondary | ICD-10-CM

## 2019-02-23 DIAGNOSIS — I10 Essential (primary) hypertension: Secondary | ICD-10-CM | POA: Diagnosis not present

## 2019-02-23 DIAGNOSIS — R7309 Other abnormal glucose: Secondary | ICD-10-CM

## 2019-02-23 DIAGNOSIS — E782 Mixed hyperlipidemia: Secondary | ICD-10-CM

## 2019-02-23 DIAGNOSIS — I2510 Atherosclerotic heart disease of native coronary artery without angina pectoris: Secondary | ICD-10-CM

## 2019-02-23 DIAGNOSIS — Z950 Presence of cardiac pacemaker: Secondary | ICD-10-CM | POA: Diagnosis not present

## 2019-02-23 DIAGNOSIS — Z85828 Personal history of other malignant neoplasm of skin: Secondary | ICD-10-CM

## 2019-02-23 DIAGNOSIS — I442 Atrioventricular block, complete: Secondary | ICD-10-CM

## 2019-02-23 DIAGNOSIS — Z6830 Body mass index (BMI) 30.0-30.9, adult: Secondary | ICD-10-CM

## 2019-02-23 DIAGNOSIS — Z6829 Body mass index (BMI) 29.0-29.9, adult: Secondary | ICD-10-CM

## 2019-02-23 NOTE — Patient Instructions (Addendum)
Goals    . Blood Pressure < 130/80    . Exercise 150 min/wk Moderate Activity    . LDL CALC < 70    . Weight (lb) < 150 lb (68 kg)         Water/fluid intake - aim for 65+ fluid ounces daily   Can add a daily soluble fiber - citrucel/benefiber, etc- can help with constipation AND with cholesterol      Preventing High Cholesterol Cholesterol is a white, waxy substance similar to fat that the human body needs to help build cells. The liver makes all the cholesterol that a person's body needs. Having high cholesterol (hypercholesterolemia) increases a person's risk for heart disease and stroke. Extra (excess) cholesterol comes from the food the person eats. High cholesterol can often be prevented with diet and lifestyle changes. If you already have high cholesterol, you can control it with diet and lifestyle changes and with medicine. How can high cholesterol affect me? If you have high cholesterol, deposits (plaques) may build up on the walls of your arteries. The arteries are the blood vessels that carry blood away from your heart. Plaques make the arteries narrower and stiffer. This can limit or block blood flow and cause blood clots to form. Blood clots:  Are tiny balls of cells that form in your blood.  Can move to the heart or brain, causing a heart attack or stroke. Plaques in arteries greatly increase your risk for heart attack and stroke.Making diet and lifestyle changes can reduce your risk for these conditions that may threaten your life. What can increase my risk? This condition is more likely to develop in people who:  Eat foods that are high in saturated fat or cholesterol. Saturated fat is mostly found in: ? Foods that contain animal fat, such as red meat and some dairy products. ? Certain fatty foods made from plants, such as tropical oils.  Are overweight.  Are not getting enough exercise.  Have a family history of high cholesterol. What actions can I take to  prevent this? Nutrition   Eat less saturated fat.  Avoid trans fats (partially hydrogenated oils). These are often found in margarine and in some baked goods, fried foods, and snacks bought in packages.  Avoid precooked or cured meat, such as sausages or meat loaves.  Avoid foods and drinks that have added sugars.  Eat more fruits, vegetables, and whole grains.  Choose healthy sources of protein, such as fish, poultry, lean cuts of red meat, beans, peas, lentils, and nuts.  Choose healthy sources of fat, such as: ? Nuts. ? Vegetable oils, especially olive oil. ? Fish that have healthy fats (omega-3 fatty acids), such as mackerel or salmon. The items listed above may not be a complete list of recommended foods and beverages. Contact a dietitian for more information. Lifestyle  Lose weight if you are overweight. Losing 5-10 lb (2.3-4.5 kg) can help prevent or control high cholesterol. It can also lower your risk for diabetes and high blood pressure. Ask your health care provider to help you with a diet and exercise plan to lose weight safely.  Do not use any products that contain nicotine or tobacco, such as cigarettes, e-cigarettes, and chewing tobacco. If you need help quitting, ask your health care provider.  Limit your alcohol intake. ? Do not drink alcohol if:  Your health care provider tells you not to drink.  You are pregnant, may be pregnant, or are planning to become pregnant. ? If  you drink alcohol:  Limit how much you use to:  0-1 drink a day for women.  0-2 drinks a day for men.  Be aware of how much alcohol is in your drink. In the [place] one drink equals one 12 oz bottle of beer (355 mL), one 5 oz glass of wine (148 mL), or one 1 oz glass of hard liquor (44 mL). Activity   Get enough exercise. Each week, do at least 150 minutes of exercise that takes a medium level of effort (moderate-intensity exercise). ? This is exercise that:  Makes your heart beat  faster and makes you breathe harder than usual.  Allows you to still be able to talk. ? You could exercise in short sessions several times a day or longer sessions a few times a week. For example, on 5 days each week, you could walk fast or ride your bike 3 times a day for 10 minutes each time.  Do exercises as told by your health care provider. Medicines  In addition to diet and lifestyle changes, your health care provider may recommend medicines to help lower cholesterol. This may be a medicine to lower the amount of cholesterol your liver makes. You may need medicine if: ? Diet and lifestyle changes do not lower your cholesterol enough. ? You have high cholesterol and other risk factors for heart disease or stroke.  Take over-the-counter and prescription medicines only as told by your health care provider. General information  Manage your risk factors for high cholesterol. Talk with your health care provider about all your risk factors and how to lower your risk.  Manage other conditions that you have, such as diabetes or high blood pressure (hypertension).  Have blood tests to check your cholesterol levels at regular points in time as told by your health care provider.  Keep all follow-up visits as told by your health care provider. This is important. Where to find more information  American Heart Association: www.heart.org  National Heart, Lung, and Blood Institute: https://wilson-eaton.com/ Summary  High cholesterol increases your risk for heart disease and stroke. By keeping your cholesterol level low, you can reduce your risk for these conditions.  High cholesterol can often be prevented with diet and lifestyle changes.  Work with your health care provider to manage your risk factors, and have your blood tested regularly. This information is not intended to replace advice given to you by your health care provider. Make sure you discuss any questions you have with your health care  provider. Document Released: 03/13/2015 Document Revised: 06/20/2018 Document Reviewed: 11/05/2015 Elsevier Patient Education  2020 [employer].

## 2019-02-24 LAB — CBC WITH DIFFERENTIAL/PLATELET
Absolute Monocytes: 829 cells/uL (ref 200–950)
Basophils Absolute: 90 cells/uL (ref 0–200)
Basophils Relative: 1.6 %
Eosinophils Absolute: 560 cells/uL — ABNORMAL HIGH (ref 15–500)
Eosinophils Relative: 10 %
HCT: 39.3 % (ref 35.0–45.0)
Hemoglobin: 13.3 g/dL (ref 11.7–15.5)
Lymphs Abs: 1120 cells/uL (ref 850–3900)
MCH: 30.7 pg (ref 27.0–33.0)
MCHC: 33.8 g/dL (ref 32.0–36.0)
MCV: 90.8 fL (ref 80.0–100.0)
MPV: 10.2 fL (ref 7.5–12.5)
Monocytes Relative: 14.8 %
Neutro Abs: 3002 cells/uL (ref 1500–7800)
Neutrophils Relative %: 53.6 %
Platelets: 260 10*3/uL (ref 140–400)
RBC: 4.33 10*6/uL (ref 3.80–5.10)
RDW: 12.3 % (ref 11.0–15.0)
Total Lymphocyte: 20 %
WBC: 5.6 10*3/uL (ref 3.8–10.8)

## 2019-02-24 LAB — COMPLETE METABOLIC PANEL WITH GFR
AG Ratio: 1.7 (calc) (ref 1.0–2.5)
ALT: 16 U/L (ref 6–29)
AST: 20 U/L (ref 10–35)
Albumin: 4 g/dL (ref 3.6–5.1)
Alkaline phosphatase (APISO): 61 U/L (ref 37–153)
BUN: 13 mg/dL (ref 7–25)
CO2: 28 mmol/L (ref 20–32)
Calcium: 9.5 mg/dL (ref 8.6–10.4)
Chloride: 104 mmol/L (ref 98–110)
Creat: 0.86 mg/dL (ref 0.60–0.93)
GFR, Est African American: 77 mL/min/{1.73_m2} (ref >=60)
GFR, Est Non African American: 66 mL/min/{1.73_m2} (ref >=60)
Globulin: 2.4 g/dL (calc) (ref 1.9–3.7)
Glucose, Bld: 103 mg/dL — ABNORMAL HIGH (ref 65–99)
Potassium: 4.8 mmol/L (ref 3.5–5.3)
Sodium: 139 mmol/L (ref 135–146)
Total Bilirubin: 0.7 mg/dL (ref 0.2–1.2)
Total Protein: 6.4 g/dL (ref 6.1–8.1)

## 2019-02-24 LAB — LIPID PANEL
Cholesterol: 198 mg/dL (ref <200)
HDL: 63 mg/dL (ref >=50)
LDL Cholesterol (Calc): 108 mg/dL (calc) — ABNORMAL HIGH
Non-HDL Cholesterol (Calc): 135 mg/dL (calc) — ABNORMAL HIGH (ref <130)
Total CHOL/HDL Ratio: 3.1 (calc) (ref <5.0)
Triglycerides: 154 mg/dL — ABNORMAL HIGH (ref <150)

## 2019-02-24 LAB — TSH: TSH: 2.42 mIU/L (ref 0.40–4.50)

## 2019-02-27 ENCOUNTER — Ambulatory Visit (INDEPENDENT_AMBULATORY_CARE_PROVIDER_SITE_OTHER): Payer: Medicare Other | Admitting: *Deleted

## 2019-02-27 DIAGNOSIS — I442 Atrioventricular block, complete: Secondary | ICD-10-CM

## 2019-02-27 LAB — CUP PACEART REMOTE DEVICE CHECK
Battery Remaining Longevity: 141 mo
Battery Voltage: 3.03 V
Brady Statistic AP VP Percent: 1.12 %
Brady Statistic AP VS Percent: 0 %
Brady Statistic AS VP Percent: 98.79 %
Brady Statistic AS VS Percent: 0.09 %
Brady Statistic RA Percent Paced: 1.13 %
Brady Statistic RV Percent Paced: 99.91 %
Date Time Interrogation Session: 20201217204334
Implantable Lead Implant Date: 20200102
Implantable Lead Implant Date: 20200102
Implantable Lead Location: 753859
Implantable Lead Location: 753860
Implantable Lead Model: 4076
Implantable Lead Model: 4076
Lead Channel Impedance Value: 342 Ohm
Lead Channel Impedance Value: 380 Ohm
Lead Channel Impedance Value: 399 Ohm
Lead Channel Impedance Value: 456 Ohm
Lead Channel Pacing Threshold Amplitude: 0.5 V
Lead Channel Pacing Threshold Amplitude: 0.625 V
Lead Channel Pacing Threshold Pulse Width: 0.4 ms
Lead Channel Pacing Threshold Pulse Width: 0.4 ms
Lead Channel Sensing Intrinsic Amplitude: 12 mV
Lead Channel Sensing Intrinsic Amplitude: 12 mV
Lead Channel Sensing Intrinsic Amplitude: 2.75 mV
Lead Channel Sensing Intrinsic Amplitude: 2.75 mV
Lead Channel Setting Pacing Amplitude: 1.5 V
Lead Channel Setting Pacing Amplitude: 2 V
Lead Channel Setting Pacing Pulse Width: 0.4 ms
Lead Channel Setting Sensing Sensitivity: 2 mV

## 2019-03-18 NOTE — Progress Notes (Signed)
PPM Remote  

## 2019-04-05 NOTE — Progress Notes (Signed)
Cardiology Office Note Date:  04/07/2019  Patient ID:  Kristy [name] 1945/11/24, MRN [id number] PCP:  Unk Dupler, MD  Electrophysiologist  Dr. Rayann Heman  refresh   Chief Complaint:  planned 4 mo f/u  History of Present Illness: Kristy [name] is a 74 y.o. female with history of CAD (PCI March 2020), HTN, HLD, CHB w/PPM.  She comes in today to be seen for Dr. Rayann Heman.  She saw him for the 1st time via tele health Sept 2020.  This was her 1st visit to get established with EP after moving from [place].  She was doing well, planned for in clinic follow pup in 4 mo, and then annually.  She feels well.  Not yet gotten into their place yet unfortunately construction/renovations not yet completed and still with her step son and hi family.  She does not formally exercise but is up/down the stairs numerous times a day, does the shopping, helps around the house, all without symptoms or difficulty.  No CP, palpitations or SOB,no DOE.  No dizzy spells, near syncope or syncope.  She is hoping at some point to be able to get off some of the medicines.  Device information MDT dual chamber PPM implanted 03/13/2018 (in [place])   Past Medical History:  Diagnosis Date  . Abnormal glucose 11/18/2018  . Allergy    penicillin  . Arteriosclerotic heart disease (ASHD) 11/18/2018   s/p PCI 05/2018  . Cardiac pacemaker 11/18/2018  . Complete heart block ([place]) 11/18/2018  . Essential hypertension 11/18/2018  . Hyperlipidemia 2010  . Hypertension 2000  . Osteoporosis     Past Surgical History:  Procedure Laterality Date  . CHOLECYSTECTOMY  2011  . CORONARY ANGIOPLASTY WITH STENT PLACEMENT  05/2018  . mohl  2009   face  . PACEMAKER IMPLANT  03/2018   MDT pacemaker implanted in [place] Right 2016    Current Outpatient Medications  Medication Sig Dispense Refill  . amLODipine (NORVASC) 5 MG tablet Take 5 mg by mouth daily.    . Ascorbic Acid (VITAMIN C PO) Take 1,000 mg by mouth daily.    Marland Kitchen  aspirin EC 81 MG tablet Take 81 mg by mouth daily.    . CHOLECALCIFEROL PO Take 5,000 Units by mouth daily.    . clopidogrel (PLAVIX) 75 MG tablet Take 75 mg by mouth daily.    . famotidine (PEPCID) 20 MG tablet Take 20 mg by mouth daily.    . Lutein 20 MG CAPS Take 1 capsule by mouth daily.    . metoprolol succinate (TOPROL-XL) 25 MG 24 hr tablet Take 25 mg by mouth daily.    . rosuvastatin (CRESTOR) 5 MG tablet Take 5 mg by mouth daily.    Marland Kitchen zinc gluconate 50 MG tablet Take 50 mg by mouth daily.     No current facility-administered medications for this visit.    Allergies:   Penicillins   Social History:  The patient  reports that she has never smoked. She has never used smokeless tobacco. She reports that she does not use drugs.   Family History:  The patient's family history includes Colon cancer in her mother; Diabetes in her father; Hypertension in her brother.  ROS:  Please see the history of present illness.  All other systems are reviewed and otherwise negative.   PHYSICAL EXAM:  VS:  BP 136/68   Pulse 86   Ht 5' 1.5" (1.562 m)   Wt 166 lb (75.3 kg)  SpO2 98%   BMI 30.86 kg/m  BMI: Body mass index is 30.86 kg/m. Well nourished, well developed, in no acute distress  HEENT: normocephalic, atraumatic  Neck: no JVD, carotid bruits or masses Cardiac: RRR; no significant murmurs, no rubs, or gallops Lungs:  CTA b/l, no wheezing, rhonchi or rales  Abd: soft, nontender, obese MS: no deformity or atrophy Ext: no edema  Skin: warm and dry, no rash Neuro:  No gross deficits appreciated Psych: euthymic mood, full affect  PPM site is stable, no tethering or discomfort   EKG:  Not done today  PPM interrogation done today and reviewed by myself:  battery and lead measurements are good No arrhythmias    05/19/2018: LHC/PCI (prompted by abnormal stress test) SEE FULL REPORT FOR FULL CORONARY DETAILS LM is large, no significant disease LAD small caliber, mid 99%  >>>PCI  with Xience DES LCx large caliber, large OM RCA moderate caliber, dominant, PDA is small caliber          Mid RCA 2 tandem lesions 50-60% Ramus, large bifrucating, no significant obstructive disease   Recent Labs: 11/18/2018: Magnesium 2.1 02/23/2019: ALT 16; BUN 13; Creat 0.86; Hemoglobin 13.3; Platelets 260; Potassium 4.8; Sodium 139; TSH 2.42  02/23/2019: Cholesterol 198; HDL 63; LDL Cholesterol (Calc) 108; Total CHOL/HDL Ratio 3.1; Triglycerides 154   CrCl cannot be calculated (Patient's most recent lab result is older than the maximum 21 days allowed.).   Wt Readings from Last 3 Encounters:  04/07/19 166 lb (75.3 kg)  02/23/19 162 lb (73.5 kg)  12/30/18 159 lb 12.8 oz (72.5 kg)     Other studies reviewed: Additional studies/records reviewed today include: summarized above  ASSESSMENT AND PLAN:  1. PPM     Intact function, no programming changes made  2. CAD     PCI to LAD March 2020     On ASA, plavix, BB, statin     No symptoms     Continue DAPT at least through a year post PCI  3. HTN     Looks good no changes  4. HLD     LDL at 108, given CAD, needs more aggressive lipid management     Will increase her to 10mg  crestor daily and recheck her lipids, LFTs in 3 months     Discussed diet choices.    Disposition: F/u with labs in 3 months, every 3 mo remotes, and see her back in 80mo, sooner if needed.  Current medicines are reviewed at length with the patient today.  The patient did not have any concerns regarding medicines.  Venetia Night, PA-C 04/07/2019 3:17 PM     [address] [place] [id number] [phone] (office)  [phone] (fax)

## 2019-04-07 ENCOUNTER — Ambulatory Visit (INDEPENDENT_AMBULATORY_CARE_PROVIDER_SITE_OTHER): Payer: Medicare Other | Admitting: Physician Assistant

## 2019-04-07 ENCOUNTER — Other Ambulatory Visit: Payer: Self-pay

## 2019-04-07 VITALS — BP 136/68 | HR 86 | Ht 61.5 in | Wt 166.0 lb

## 2019-04-07 DIAGNOSIS — E782 Mixed hyperlipidemia: Secondary | ICD-10-CM | POA: Diagnosis not present

## 2019-04-07 DIAGNOSIS — Z950 Presence of cardiac pacemaker: Secondary | ICD-10-CM

## 2019-04-07 DIAGNOSIS — I442 Atrioventricular block, complete: Secondary | ICD-10-CM | POA: Diagnosis not present

## 2019-04-07 DIAGNOSIS — I25118 Atherosclerotic heart disease of native coronary artery with other forms of angina pectoris: Secondary | ICD-10-CM | POA: Diagnosis not present

## 2019-04-07 DIAGNOSIS — I10 Essential (primary) hypertension: Secondary | ICD-10-CM

## 2019-04-07 MED ORDER — ROSUVASTATIN CALCIUM 10 MG PO TABS: 10.0000 mg | ORAL_TABLET | Freq: Every day | ORAL | 1 refills | Status: DC

## 2019-04-07 NOTE — Patient Instructions (Signed)
Medication Instructions:   START TAKING CRESTOR 10 MG ONCE A  DAY   *If you need a refill on your cardiac medications before your next appointment, please call your pharmacy*  Lab Work:  LFT AND LIPIDS RETURN INI 3 MONTHS   If you have labs (blood work) drawn today and your tests are completely normal, you will receive your results only by: Marland Kitchen MyChart Message (if you have MyChart) OR . A paper copy in the mail If you have any lab test that is abnormal or we need to change your treatment, we will call you to review the results.  Testing/Procedures:  NONE ORDERED  TODAY  Follow-Up: At [facility], you and your health needs are our priority.  As part of our continuing mission to provide you with exceptional heart care, we have created designated Provider Care Teams.  These Care Teams include your primary Cardiologist (physician) and Advanced Practice Providers (APPs -  Physician Assistants and Nurse Practitioners) who all work together to provide you with the care you need, when you need it.  Your next appointment:   6 month(s)  The format for your next appointment:   In Person  Provider:   You may see [place]  or one of the following Advanced Practice Providers on your designated Care Team:    Chanetta Marshall, NP  Tommye Standard, PA-C  Legrand Como "Oda [place], [place]   Other Instructions

## 2019-04-08 ENCOUNTER — Ambulatory Visit: Payer: Medicare Other

## 2019-04-17 ENCOUNTER — Ambulatory Visit: Payer: Medicare Other | Attending: Internal Medicine

## 2019-04-17 DIAGNOSIS — Z23 Encounter for immunization: Secondary | ICD-10-CM | POA: Insufficient documentation

## 2019-04-17 NOTE — Progress Notes (Signed)
   Covid-19 Vaccination Clinic  Name:  Kristy [name]    MRN: [id number] DOB: March 04, 1946  04/17/2019  [name] was observed post Covid-19 immunization for 15 minutes without incidence. She was provided with Vaccine Information Sheet and instruction to access the V-Safe system.   [name] was instructed to call 911 with any severe reactions post vaccine: Marland Kitchen Difficulty breathing  . Swelling of your face and throat  . A fast heartbeat  . A bad rash all over your body  . Dizziness and weakness    Immunizations Administered    Name Date Dose VIS Date Route   Pfizer COVID-19 Vaccine 04/17/2019  9:12 AM 0.3 mL 02/20/2019 Intramuscular   Manufacturer: [place]   Lot: [id number]   [place]: [id number]

## 2019-05-12 ENCOUNTER — Ambulatory Visit: Payer: Medicare Other | Attending: Internal Medicine

## 2019-05-12 DIAGNOSIS — Z23 Encounter for immunization: Secondary | ICD-10-CM

## 2019-05-12 NOTE — Progress Notes (Signed)
   Covid-19 Vaccination Clinic  Name:  Kristy [name]    MRN: [id number] DOB: [birth year]  05/12/2019  [name] was observed post Covid-19 immunization for 15 minutes without incident. She was provided with Vaccine Information Sheet and instruction to access the V-Safe system.   Ms. Warning was instructed to call 911 with any severe reactions post vaccine: Marland Kitchen Difficulty breathing  . Swelling of face and throat  . A fast heartbeat  . A bad rash all over body  . Dizziness and weakness   Immunizations Administered    Name Date Dose VIS Date Route   Pfizer COVID-19 Vaccine 05/12/2019 10:05 AM 0.3 mL 02/20/2019 Intramuscular   Manufacturer: [place]   Lot: [id number]   [place]: [id number]

## 2019-05-26 ENCOUNTER — Ambulatory Visit (INDEPENDENT_AMBULATORY_CARE_PROVIDER_SITE_OTHER): Payer: Medicare Other | Admitting: Internal Medicine

## 2019-05-26 ENCOUNTER — Other Ambulatory Visit: Payer: Self-pay | Admitting: Internal Medicine

## 2019-05-26 ENCOUNTER — Other Ambulatory Visit: Payer: Self-pay

## 2019-05-26 ENCOUNTER — Encounter: Payer: Self-pay | Admitting: Internal Medicine

## 2019-05-26 VITALS — BP 118/72 | HR 68 | Temp 97.6°F | Resp 16 | Ht 61.5 in | Wt 167.0 lb

## 2019-05-26 DIAGNOSIS — I10 Essential (primary) hypertension: Secondary | ICD-10-CM | POA: Diagnosis not present

## 2019-05-26 DIAGNOSIS — E559 Vitamin D deficiency, unspecified: Secondary | ICD-10-CM

## 2019-05-26 DIAGNOSIS — E782 Mixed hyperlipidemia: Secondary | ICD-10-CM | POA: Diagnosis not present

## 2019-05-26 DIAGNOSIS — Z79899 Other long term (current) drug therapy: Secondary | ICD-10-CM

## 2019-05-26 DIAGNOSIS — R7309 Other abnormal glucose: Secondary | ICD-10-CM | POA: Diagnosis not present

## 2019-05-26 DIAGNOSIS — I2510 Atherosclerotic heart disease of native coronary artery without angina pectoris: Secondary | ICD-10-CM

## 2019-05-26 NOTE — Patient Instructions (Signed)

## 2019-05-26 NOTE — Progress Notes (Signed)
History of Present Illness:       This very nice 74 y.o. MWF  presents for 6 month follow up with HTN, ASCAD/PCA/PPM, HLD, Pre-Diabetes and Vitamin D Deficiency. Patient has GERD controlled on Pepcid.      Patient is treated for HTN (2000)  & BP has been controlled at home. Today's BP is at goal - 118/72.   In  Jan 2020, a PPM was implanted in [place] for [place].  Then in  March, 2020,  she had PCA/Stent. She's on DAPT with LD bASA & Plavix. Locally she is followed now by Dr Rayann Heman. Patient has had no complaints of any cardiac type chest pain, palpitations, dyspnea / orthopnea / PND, dizziness, claudication, or dependent edema.      Hyperlipidemia was not controlled with diet & low dose Rosuvastatin 5 mg. She was counseled in stricter diet and lipids are rechecked today on 3 month schedule. Her Rosuvastatin dose was increased in Jan at Dr Jackalyn Lombard office.  Patient denies myalgias or other med SE's. Last Lipids were not at goal:  Lab Results  Component Value Date   CHOL 198 02/23/2019   HDL 63 02/23/2019   LDLCALC 108 (H) 02/23/2019   TRIG 154 (H) 02/23/2019   CHOLHDL 3.1 02/23/2019    Also, the patient is monitored expectantly for glucose intolerance and has had no symptoms of reactive hypoglycemia, diabetic polys, paresthesias or visual blurring.  Last A1c was Normal & at goal:  Lab Results  Component Value Date   HGBA1C 5.4 11/18/2018           Further, the patient also has history of Vitamin D Deficiency and supplements vitamin D without any suspected side-effects. Last vitamin D was very low (goal 70-100):  Lab Results  Component Value Date   VD25OH 32 11/18/2018    Current Outpatient Medications on File Prior to Visit  Medication Sig  . amLODipine (NORVASC) 5 MG tablet Take 5 mg by mouth daily.  . Ascorbic Acid (VITAMIN C PO) Take 1,000 mg by mouth daily.  Marland Kitchen aspirin EC 81 MG tablet Take 81 mg by mouth daily.  . CHOLECALCIFEROL PO Take 5,000 Units by mouth daily.  .  clopidogrel (PLAVIX) 75 MG tablet Take 75 mg by mouth daily.  . famotidine (PEPCID) 20 MG tablet Take 20 mg by mouth daily.  . Lutein 20 MG CAPS Take 1 capsule by mouth daily.  . metoprolol succinate (TOPROL-XL) 25 MG 24 hr tablet Take 25 mg by mouth daily.  . rosuvastatin (CRESTOR) 10 MG tablet Take 1 tablet (10 mg total) by mouth daily.  Marland Kitchen zinc gluconate 50 MG tablet Take 50 mg by mouth daily.   No current facility-administered medications on file prior to visit.    Allergies  Allergen Reactions  . Penicillins     PMHx:   Past Medical History:  Diagnosis Date  . Abnormal glucose 11/18/2018  . Allergy    penicillin  . Arteriosclerotic heart disease (ASHD) 11/18/2018   s/p PCI 05/2018  . Cardiac pacemaker 11/18/2018  . Complete heart block ([place]) 11/18/2018  . Essential hypertension 11/18/2018  . Hyperlipidemia 2010  . Hypertension 2000  . Osteoporosis     Immunization History  Administered Date(s) Administered  . Fluad Quad(high Dose 65+) 12/30/2018  . PFIZER SARS-COV-2 Vaccination 04/17/2019, 05/12/2019  . Pneumococcal Conjugate-13 04/12/2018  . Zoster Recombinat (Shingrix) 02/09/2018, 04/29/2018    Past Surgical History:  Procedure Laterality Date  . CHOLECYSTECTOMY  2011  .  CORONARY ANGIOPLASTY WITH STENT PLACEMENT  05/2018  . mohl  2009   face  . PACEMAKER IMPLANT  03/2018   MDT pacemaker implanted in [place] Right 2016    FHx:    Reviewed / unchanged  SHx:    Reviewed / unchanged   Systems Review:  Constitutional: Denies fever, chills, wt changes, headaches, insomnia, fatigue, night sweats, change in appetite. Eyes: Denies redness, blurred vision, diplopia, discharge, itchy, watery eyes.  ENT: Denies discharge, congestion, post nasal drip, epistaxis, sore throat, earache, hearing loss, dental pain, tinnitus, vertigo, sinus pain, snoring.  CV: Denies chest pain, palpitations, irregular heartbeat, syncope, dyspnea, diaphoresis, orthopnea, PND,  claudication or edema. Respiratory: denies cough, dyspnea, DOE, pleurisy, hoarseness, laryngitis, wheezing.  Gastrointestinal: Denies dysphagia, odynophagia, heartburn, reflux, water brash, abdominal pain or cramps, nausea, vomiting, bloating, diarrhea, constipation, hematemesis, melena, hematochezia  or hemorrhoids. Genitourinary: Denies dysuria, frequency, urgency, nocturia, hesitancy, discharge, hematuria or flank pain. Musculoskeletal: Denies arthralgias, myalgias, stiffness, jt. swelling, pain, limping or strain/sprain.  Skin: Denies pruritus, rash, hives, warts, acne, eczema or change in skin lesion(s). Neuro: No weakness, tremor, incoordination, spasms, paresthesia or pain. Psychiatric: Denies confusion, memory loss or sensory loss. Endo: Denies change in weight, skin or hair change.  Heme/Lymph: No excessive bleeding, bruising or enlarged lymph nodes.  Physical Exam  BP 118/72   Pulse 68   Temp 97.6 F (36.4 C)   Resp 16   Ht 5' 1.5" (1.562 m)   Wt 167 lb (75.8 kg)   BMI 31.04 kg/m   Appears  well nourished, well groomed  and in no distress.  Eyes: PERRLA, EOMs, conjunctiva no swelling or erythema. Sinuses: No frontal/maxillary tenderness ENT/Mouth: EAC's clear, TM's nl w/o erythema, bulging. Nares clear w/o erythema, swelling, exudates. Oropharynx clear without erythema or exudates. Oral hygiene is good. Tongue normal, non obstructing. Hearing intact.  Neck: Supple. Thyroid not palpable. Car 2+/2+ without bruits, nodes or JVD. Chest: Respirations nl with BS clear & equal w/o rales, rhonchi, wheezing or stridor.  Cor: Heart sounds normal w/ regular rate and rhythm without sig. murmurs, gallops, clicks or rubs. Peripheral pulses normal and equal  without edema.  Abdomen: Soft & bowel sounds normal. Non-tender w/o guarding, rebound, hernias, masses or organomegaly.  Lymphatics: Unremarkable.  Musculoskeletal: Full ROM all peripheral extremities, joint stability, 5/5 strength  and normal gait.  Skin: Warm, dry without exposed rashes, lesions or ecchymosis apparent.  Neuro: Cranial nerves intact, reflexes equal bilaterally. Sensory-motor testing grossly intact. Tendon reflexes grossly intact.  Pysch: Alert & oriented x 3.  Insight and judgement nl & appropriate. No ideations.  Assessment and Plan:  1. Essential hypertension  - Continue medication, monitor blood pressure at home.  - Continue DASH diet.  Reminder to go to the ER if any CP,  SOB, nausea, dizziness, severe HA, changes vision/speech.  - CBC with Differential/Platelet - COMPLETE METABOLIC PANEL WITH GFR - Magnesium - TSH  2. Hyperlipidemia, mixed  - Continue diet/meds, exercise,& lifestyle modifications.  - Continue monitor periodic cholesterol/liver & renal functions   - Lipid panel - TSH  3. Abnormal glucose  - Continue diet, exercise  - Lifestyle modifications.  - Monitor appropriate labs.  - Hemoglobin A1c - Insulin, random  4. Vitamin D deficiency  - Continue supplementation.  - VITAMIN D 25 Hydroxy  5. Arteriosclerotic heart disease (ASHD)  - Lipid panel  6. Medication management  - CBC with Differential/Platelet - COMPLETE METABOLIC PANEL WITH GFR - Magnesium -  Lipid panel - TSH - Hemoglobin A1c - Insulin, random - VITAMIN D 25 Hydroxy         Discussed  regular exercise, BP monitoring, weight control to achieve/maintain BMI less than 25 and discussed med and SE's. Recommended labs to assess and monitor clinical status with further disposition pending results of labs.  I discussed the assessment and treatment plan with the patient. The patient was provided an opportunity to ask questions and all were answered. The patient agreed with the plan and demonstrated an understanding of the instructions.  I provided over 30 minutes of exam, counseling, chart review and  complex critical decision making.   Kirtland Bouchard, MD

## 2019-05-27 LAB — CBC WITH DIFFERENTIAL/PLATELET
Absolute Monocytes: 598 cells/uL (ref 200–950)
Basophils Absolute: 48 cells/uL (ref 0–200)
Basophils Relative: 1.1 %
Eosinophils Absolute: 427 cells/uL (ref 15–500)
Eosinophils Relative: 9.7 %
HCT: 39.5 % (ref 35.0–45.0)
Hemoglobin: 13 g/dL (ref 11.7–15.5)
Lymphs Abs: 1052 cells/uL (ref 850–3900)
MCH: 29.7 pg (ref 27.0–33.0)
MCHC: 32.9 g/dL (ref 32.0–36.0)
MCV: 90.4 fL (ref 80.0–100.0)
MPV: 10.1 fL (ref 7.5–12.5)
Monocytes Relative: 13.6 %
Neutro Abs: 2275 cells/uL (ref 1500–7800)
Neutrophils Relative %: 51.7 %
Platelets: 252 10*3/uL (ref 140–400)
RBC: 4.37 10*6/uL (ref 3.80–5.10)
RDW: 12.7 % (ref 11.0–15.0)
Total Lymphocyte: 23.9 %
WBC: 4.4 10*3/uL (ref 3.8–10.8)

## 2019-05-27 LAB — LIPID PANEL
Cholesterol: 166 mg/dL (ref <200)
HDL: 58 mg/dL (ref >=50)
LDL Cholesterol (Calc): 85 mg/dL (calc)
Non-HDL Cholesterol (Calc): 108 mg/dL (calc) (ref <130)
Total CHOL/HDL Ratio: 2.9 (calc) (ref <5.0)
Triglycerides: 130 mg/dL (ref <150)

## 2019-05-27 LAB — COMPLETE METABOLIC PANEL WITH GFR
AG Ratio: 2 (calc) (ref 1.0–2.5)
ALT: 16 U/L (ref 6–29)
AST: 19 U/L (ref 10–35)
Albumin: 4 g/dL (ref 3.6–5.1)
Alkaline phosphatase (APISO): 61 U/L (ref 37–153)
BUN: 13 mg/dL (ref 7–25)
CO2: 27 mmol/L (ref 20–32)
Calcium: 9.1 mg/dL (ref 8.6–10.4)
Chloride: 106 mmol/L (ref 98–110)
Creat: 0.8 mg/dL (ref 0.60–0.93)
GFR, Est African American: 83 mL/min/{1.73_m2} (ref >=60)
GFR, Est Non African American: 72 mL/min/{1.73_m2} (ref >=60)
Globulin: 2 g/dL (calc) (ref 1.9–3.7)
Glucose, Bld: 108 mg/dL — ABNORMAL HIGH (ref 65–99)
Potassium: 4.8 mmol/L (ref 3.5–5.3)
Sodium: 138 mmol/L (ref 135–146)
Total Bilirubin: 0.8 mg/dL (ref 0.2–1.2)
Total Protein: 6 g/dL — ABNORMAL LOW (ref 6.1–8.1)

## 2019-05-27 LAB — HEMOGLOBIN A1C
Hgb A1c MFr Bld: 5.4 % of total Hgb (ref <5.7)
Mean Plasma Glucose: 108 (calc)
eAG (mmol/L): 6 (calc)

## 2019-05-27 LAB — INSULIN, RANDOM: Insulin: 5.3 u[IU]/mL

## 2019-05-27 LAB — VITAMIN D 25 HYDROXY (VIT D DEFICIENCY, FRACTURES): Vit D, 25-Hydroxy: 36 ng/mL (ref 30–100)

## 2019-05-27 LAB — TSH: TSH: 2.65 mIU/L (ref 0.40–4.50)

## 2019-05-27 LAB — MAGNESIUM: Magnesium: 2.1 mg/dL (ref 1.5–2.5)

## 2019-05-29 ENCOUNTER — Ambulatory Visit (INDEPENDENT_AMBULATORY_CARE_PROVIDER_SITE_OTHER): Payer: Medicare Other | Admitting: *Deleted

## 2019-05-29 DIAGNOSIS — I442 Atrioventricular block, complete: Secondary | ICD-10-CM | POA: Diagnosis not present

## 2019-05-29 LAB — CUP PACEART REMOTE DEVICE CHECK
Battery Remaining Longevity: 138 mo
Battery Voltage: 3.02 V
Brady Statistic AP VP Percent: 2.55 %
Brady Statistic AP VS Percent: 0 %
Brady Statistic AS VP Percent: 97.43 %
Brady Statistic AS VS Percent: 0.02 %
Brady Statistic RA Percent Paced: 2.54 %
Brady Statistic RV Percent Paced: 99.98 %
Date Time Interrogation Session: 20210319010748
Implantable Lead Implant Date: 20200102
Implantable Lead Implant Date: 20200102
Implantable Lead Location: 753859
Implantable Lead Location: 753860
Implantable Lead Model: 4076
Implantable Lead Model: 4076
Lead Channel Impedance Value: 361 Ohm
Lead Channel Impedance Value: 399 Ohm
Lead Channel Impedance Value: 399 Ohm
Lead Channel Impedance Value: 475 Ohm
Lead Channel Pacing Threshold Amplitude: 0.5 V
Lead Channel Pacing Threshold Amplitude: 0.625 V
Lead Channel Pacing Threshold Pulse Width: 0.4 ms
Lead Channel Pacing Threshold Pulse Width: 0.4 ms
Lead Channel Sensing Intrinsic Amplitude: 12 mV
Lead Channel Sensing Intrinsic Amplitude: 12 mV
Lead Channel Sensing Intrinsic Amplitude: 4.25 mV
Lead Channel Sensing Intrinsic Amplitude: 4.25 mV
Lead Channel Setting Pacing Amplitude: 1.5 V
Lead Channel Setting Pacing Amplitude: 2 V
Lead Channel Setting Pacing Pulse Width: 0.4 ms
Lead Channel Setting Sensing Sensitivity: 2 mV

## 2019-05-29 NOTE — Progress Notes (Signed)
PPM Remote  

## 2019-07-06 ENCOUNTER — Other Ambulatory Visit: Payer: Medicare Other

## 2019-08-05 ENCOUNTER — Ambulatory Visit (INDEPENDENT_AMBULATORY_CARE_PROVIDER_SITE_OTHER): Payer: Medicare Other | Admitting: Internal Medicine

## 2019-08-05 ENCOUNTER — Encounter: Payer: Self-pay | Admitting: Internal Medicine

## 2019-08-05 ENCOUNTER — Other Ambulatory Visit: Payer: Self-pay

## 2019-08-05 VITALS — BP 138/74 | HR 104 | Ht 62.0 in

## 2019-08-05 DIAGNOSIS — I442 Atrioventricular block, complete: Secondary | ICD-10-CM | POA: Diagnosis not present

## 2019-08-05 DIAGNOSIS — Z950 Presence of cardiac pacemaker: Secondary | ICD-10-CM | POA: Diagnosis not present

## 2019-08-05 DIAGNOSIS — I2510 Atherosclerotic heart disease of native coronary artery without angina pectoris: Secondary | ICD-10-CM | POA: Diagnosis not present

## 2019-08-05 DIAGNOSIS — E782 Mixed hyperlipidemia: Secondary | ICD-10-CM

## 2019-08-05 DIAGNOSIS — I10 Essential (primary) hypertension: Secondary | ICD-10-CM | POA: Diagnosis not present

## 2019-08-05 NOTE — Patient Instructions (Addendum)
Medication Instructions:  Your physician has recommended you make the following change in your medication:   1.  Stop taking PLAVIX  2.  Stop taking famotidine (Pepcid)   Labwork: None ordered.  Testing/Procedures: None ordered.  Follow-Up: Your physician wants you to follow-up in: one year with Tommye Standard, PA.   You will receive a reminder letter in the mail two months in advance. If you don't receive a letter, please call our office to schedule the follow-up appointment.  Remote monitoring is used to monitor your Pacemaker from home. This monitoring reduces the number of office visits required to check your device to one time per year. It allows [place] to keep an eye on the functioning of your device to ensure it is working properly. You are scheduled for a device check from home on 08/28/2019. You may send your transmission at any time that day. If you have a wireless device, the transmission will be sent automatically. After your physician reviews your transmission, you will receive a postcard with your next transmission date.  Any Other Special Instructions Will Be Listed Below (If Applicable).  If you need a refill on your cardiac medications before your next appointment, please call your pharmacy.

## 2019-08-05 NOTE — Progress Notes (Signed)
    PCP: Unk Curtiss, MD   Primary EP:  Dr Rayann Heman  Kristy [name] is a 74 y.o. female who presents today for routine electrophysiology followup.  Since last being seen in our clinic, the patient reports doing very well.  Today, she denies symptoms of palpitations, chest pain, shortness of breath,  lower extremity edema, dizziness, presyncope, or syncope.  The patient is otherwise without complaint today.   Past Medical History:  Diagnosis Date  . Abnormal glucose 11/18/2018  . Allergy    penicillin  . Arteriosclerotic heart disease (ASHD) 11/18/2018   s/p PCI 05/2018  . Cardiac pacemaker 11/18/2018  . Complete heart block ([place]) 11/18/2018  . Essential hypertension 11/18/2018  . Hyperlipidemia 2010  . Hypertension 2000  . Osteoporosis    Past Surgical History:  Procedure Laterality Date  . CHOLECYSTECTOMY  2011  . CORONARY ANGIOPLASTY WITH STENT PLACEMENT  05/2018  . mohl  2009   face  . PACEMAKER IMPLANT  03/2018   MDT pacemaker implanted in [place] Right 2016    ROS- all systems are reviewed and negative except as per HPI above  Current Outpatient Medications  Medication Sig Dispense Refill  . amLODipine (NORVASC) 5 MG tablet Take 5 mg by mouth daily.    . Ascorbic Acid (VITAMIN C PO) Take 1,000 mg by mouth daily.    Marland Kitchen aspirin EC 81 MG tablet Take 81 mg by mouth daily.    . CHOLECALCIFEROL PO Take 5,000 Units by mouth daily.    . clopidogrel (PLAVIX) 75 MG tablet Take 75 mg by mouth daily.    . famotidine (PEPCID) 20 MG tablet Take 20 mg by mouth daily.    . Lutein 20 MG CAPS Take 1 capsule by mouth daily.    . metoprolol succinate (TOPROL-XL) 25 MG 24 hr tablet Take 25 mg by mouth daily.    . rosuvastatin (CRESTOR) 10 MG tablet Take 1 tablet (10 mg total) by mouth daily. 90 tablet 1  . zinc gluconate 50 MG tablet Take 50 mg by mouth daily.     No current facility-administered medications for this visit.    Physical Exam: Vitals:   08/05/19 1440  BP:  (!) 150/76  Pulse: (!) 104  SpO2: 96%  Height: 5\' 2"  (1.575 m)    GEN- The patient is well appearing, alert and oriented x 3 today.   Head- normocephalic, atraumatic Eyes-  Sclera clear, conjunctiva pink Ears- hearing intact Oropharynx- clear Lungs-   normal work of breathing Chest- pacemaker pocket is well healed Heart- Regular rate and rhythm  GI- soft, NT, ND, + BS Extremities- no clubbing, cyanosis, or edema  Pacemaker interrogation- reviewed in detail today,  See PACEART report  ekg tracing ordered today is personally reviewed and shows sinus, V paced  Assessment and Plan:  1.  Complete heart block Normal pacemaker function See Pace Art report No changes today she has a junctional escape  2. CAD No ischemic symptoms Stop plavix   3. HL Continue statin  4. Hypertensive cardiovascular disease Stable No change required today   Risks, benefits and potential toxicities for medications prescribed and/or refilled reviewed with patient today.   Return to see EP APP in a year  Thompson Grayer MD, [facility] 08/05/2019 2:50 PM

## 2019-08-17 ENCOUNTER — Other Ambulatory Visit: Payer: Self-pay

## 2019-08-19 ENCOUNTER — Other Ambulatory Visit: Payer: Self-pay

## 2019-08-19 MED ORDER — ROSUVASTATIN CALCIUM 10 MG PO TABS: 10.0000 mg | ORAL_TABLET | Freq: Every day | ORAL | 3 refills | Status: DC

## 2019-08-28 ENCOUNTER — Ambulatory Visit (INDEPENDENT_AMBULATORY_CARE_PROVIDER_SITE_OTHER): Payer: Medicare Other | Admitting: *Deleted

## 2019-08-28 DIAGNOSIS — I442 Atrioventricular block, complete: Secondary | ICD-10-CM | POA: Diagnosis not present

## 2019-08-28 LAB — CUP PACEART REMOTE DEVICE CHECK
Battery Remaining Longevity: 134 mo
Battery Voltage: 3.02 V
Brady Statistic AP VP Percent: 0.58 %
Brady Statistic AP VS Percent: 0 %
Brady Statistic AS VP Percent: 99.4 %
Brady Statistic AS VS Percent: 0.01 %
Brady Statistic RA Percent Paced: 0.58 %
Brady Statistic RV Percent Paced: 99.99 %
Date Time Interrogation Session: 20210618001431
Implantable Lead Implant Date: 20200102
Implantable Lead Implant Date: 20200102
Implantable Lead Location: 753859
Implantable Lead Location: 753860
Implantable Lead Model: 4076
Implantable Lead Model: 4076
Implantable Pulse Generator Implant Date: 20200102
Lead Channel Impedance Value: 361 Ohm
Lead Channel Impedance Value: 380 Ohm
Lead Channel Impedance Value: 399 Ohm
Lead Channel Impedance Value: 437 Ohm
Lead Channel Pacing Threshold Amplitude: 0.5 V
Lead Channel Pacing Threshold Amplitude: 0.625 V
Lead Channel Pacing Threshold Pulse Width: 0.4 ms
Lead Channel Pacing Threshold Pulse Width: 0.4 ms
Lead Channel Sensing Intrinsic Amplitude: 12 mV
Lead Channel Sensing Intrinsic Amplitude: 12 mV
Lead Channel Sensing Intrinsic Amplitude: 3.25 mV
Lead Channel Sensing Intrinsic Amplitude: 3.25 mV
Lead Channel Setting Pacing Amplitude: 1.5 V
Lead Channel Setting Pacing Amplitude: 2 V
Lead Channel Setting Pacing Pulse Width: 0.4 ms
Lead Channel Setting Sensing Sensitivity: 2 mV

## 2019-08-28 NOTE — Progress Notes (Signed)
Remote pacemaker transmission.   

## 2019-09-01 DIAGNOSIS — E669 Obesity, unspecified: Secondary | ICD-10-CM | POA: Insufficient documentation

## 2019-09-01 NOTE — Progress Notes (Signed)
MEDICARE ANNUAL WELLNESS VISIT AND FOLLOW UP  Assessment:   Diagnoses and all orders for this visit:  Encounter for Medicare annual wellness exam Hep C screening completed Pneumococcal 23 valent administered without complication today   Complete heart block/ S/p pacemaker Now follows with Dr. Rayann Heman Doing well s/p pacemaker Monitor   Arteriosclerotic heart disease S/p stent 05/2018; completed DAPT x 1 year, now on bASA Denies CP Follows with cardiology Control blood pressure, cholesterol, glucose, encourage lifestyle/exercise   Hypertension Well controlled with current medications  Monitor blood pressure at home; patient to call if consistently greater than 130/80 Continue DASH diet.   Reminder to go to the ER if any CP, SOB, nausea, dizziness, severe HA, changes vision/speech, left arm numbness and tingling and jaw pain.  Cholesterol Continue statin therapy for LDL  LDL goal of <70 reviewed; newly increased to 10 mg rosuvastatin daily  Continue low cholesterol diet and exercise.  Check lipid panel.   Other abnormal glucse Recent A1Cs at goal Discussed diet/exercise, weight management  Defer A1C; check CMP for serum glucose, monitor weight trends  Obese - BMI 30 Long discussion about weight loss, diet, and exercise Recommended diet heavy in fruits and veggies and low in animal meats, cheeses, and dairy products, appropriate calorie intake Discussed ideal weight for height  and initial weight goal (<150 lb) Patient will work on eating out less and walking once moves to new home Will follow up in 3 months  Vitamin D Def Below goal at last visit; newly taking 10000 IU daily  continue to recommend supplementation to maintain goal of 60-100 Defer Vit D level, check at follow up  History of skin cancer Follows regularly with dermatology   Over 40 minutes of exam, counseling, chart review and critical decision making was performed Future Appointments  Date Time  Provider [place]  11/27/2019  7:10 AM [facility] DEVICE REMOTES [facility]  12/03/2019 11:00 AM Unk Bonnes, MD [facility] None  02/26/2020  7:10 AM [facility] DEVICE REMOTES [facility]     Plan:   During the course of the visit the patient was educated and counseled about appropriate screening and preventive services including:    Pneumococcal vaccine   Prevnar 13  Influenza vaccine  Td vaccine  Screening electrocardiogram  Bone densitometry screening  Colorectal cancer screening  Diabetes screening  Glaucoma screening  Nutrition counseling   Advanced directives: requested   Subjective:  Kristy [name] is a 74 y.o. female who presents for Medicare Annual Wellness Visit and 3 month follow up.    She is new to the area, moved from [place], daughter in law comes here.   She is [facility] with [facility] Dr. Martin Majestic due to history of skin cancer, had moh's of left temple remotely. No recent concerns.   BMI is Body mass index is 30.87 kg/m., she has not been working on diet and exercise. Living with Son and family and limited other than walking dog, hasn't been able to cook, eating out a lot, will do better once they can move into the new house in a few months.  Wt Readings from Last 3 Encounters:  09/02/19 168 lb 12.8 oz (76.6 kg)  05/26/19 167 lb (75.8 kg)  04/07/19 166 lb (75.3 kg)   Hx of complete heart block in 03/2018 s/p pacemaker and continues to do well; She is now established with Dr. Rayann Heman. She had a (+) Nuclear stress test early 2020 and underwent cath and had a stent implanted (Mar  2020) and has done well since, completed 1 year of DAPT, now on bASA only.   She has had elevated blood pressure since the 90s. Her blood pressure has been controlled at home, today their BP is BP: 130/76 She does not workout. She denies chest pain, shortness of breath, dizziness.   She is on cholesterol medication  (rosuvastatin 10 mg daily) and denies myalgias. Her cholesterol is at goal. The cholesterol last visit was:   Lab Results  Component Value Date   CHOL 166 05/26/2019   HDL 58 05/26/2019   LDLCALC 85 05/26/2019   TRIG 130 05/26/2019   CHOLHDL 2.9 05/26/2019    She has not been working on diet and exercise for glucose management. Last A1C in the office was:  Lab Results  Component Value Date   HGBA1C 5.4 05/26/2019   Last GFR: Lab Results  Component Value Date   GFRNONAA 72 05/26/2019   Patient is on Vitamin D supplement.   Lab Results  Component Value Date   VD25OH 36 05/26/2019      Medication Review: Current Outpatient Medications on File Prior to Visit  Medication Sig Dispense Refill  . amLODipine (NORVASC) 5 MG tablet Take 5 mg by mouth daily.    . Ascorbic Acid (VITAMIN C PO) Take 1,000 mg by mouth daily.    Marland Kitchen aspirin EC 81 MG tablet Take 81 mg by mouth daily.    . CHOLECALCIFEROL PO Take 10,000 Units by mouth daily.    . Lutein 20 MG CAPS Take 1 capsule by mouth daily.    . metoprolol succinate (TOPROL-XL) 25 MG 24 hr tablet Take 25 mg by mouth daily.    . rosuvastatin (CRESTOR) 10 MG tablet Take 1 tablet (10 mg total) by mouth daily. 90 tablet 3  . zinc gluconate 50 MG tablet Take 50 mg by mouth daily.     No current facility-administered medications on file prior to visit.    Allergies  Allergen Reactions  . Penicillins     Current Problems (verified) Patient Active Problem List   Diagnosis Date Noted  . Obesity (BMI 30.0-34.9) 09/01/2019  . History of skin cancer 02/23/2019  . Arteriosclerotic heart disease (ASHD) 11/18/2018  . Cardiac pacemaker 11/18/2018  . Complete heart block ([place]) 11/18/2018  . Vitamin D deficiency 11/18/2018  . Abnormal glucose 11/18/2018  . Essential hypertension 11/18/2018  . Hyperlipidemia 2010    Screening Tests Immunization History  Administered Date(s) Administered  . Fluad Quad(high Dose 65+) 12/30/2018  . PFIZER  SARS-COV-2 Vaccination 04/17/2019, 05/12/2019  . Pneumococcal Conjugate-13 04/12/2018  . Zoster Recombinat (Shingrix) 02/09/2018, 04/29/2018    Preventative care: Last colonoscopy: last in [place], normal per patient, doing q5y, unsure when due, patient states will check Last mammogram: 12/2018 Last pap smear/pelvic exam: Last ? Within the last few years, follows with GYN  DEXA: has had per patient, normal, she will obtain   Prior vaccinations: TD or Tdap: Unsure, will get if needed  Influenza: 12/2018  Pneumococcal: TODAY  Prevnar13: 04/2018 Shingrix: 2/2, 2020 Covid 19: 2/2, 2021, pfizer  Names of Other Physician/Practitioners you currently use: 1. [facility] here for primary care 2. Dr. Katy Fitch, eye doctor, last visit 2021 3. Dr. Deanna Artis, dentist, last visit 2021  Patient Care Team: Unk Papaleo, MD as PCP - General (Internal Medicine) Ulla Gallo, MD as Consulting Physician (Dermatology)  SURGICAL HISTORY She  has a past surgical history that includes Cholecystectomy (2011); Rotator cuff repair (Right,  2016); mohl (2009); PACEMAKER IMPLANT (03/2018); and Coronary angioplasty with stent (05/2018). FAMILY HISTORY Her family history includes Colon cancer in her mother; Diabetes in her father; Hypertension in her brother. SOCIAL HISTORY She  reports that she has never smoked. She has never used smokeless tobacco. She reports that she does not use drugs.   MEDICARE WELLNESS OBJECTIVES: Physical activity: Current Exercise Habits: The patient does not participate in regular exercise at present, Exercise limited by: None identified Cardiac risk factors: Cardiac Risk Factors include: advanced age (>59men, >67 women);hypertension;dyslipidemia;obesity (BMI >30kg/m2);sedentary lifestyle Depression/mood screen:   Depression screen [facility] 2/9 09/02/2019  Decreased Interest 0  Down, Depressed, Hopeless 0  PHQ - 2 Score 0    ADLs:  In your  present state of health, do you have any difficulty performing the following activities: 09/02/2019 05/26/2019  Hearing? N N  Vision? N N  Difficulty concentrating or making decisions? N N  Walking or climbing stairs? N N  Dressing or bathing? N N  Doing errands, shopping? N N  Some recent data might be hidden     Cognitive Testing  Alert? Yes  Normal Appearance?Yes  Oriented to person? Yes  Place? Yes   Time? Yes  Recall of three objects?  Yes  Can perform simple calculations? Yes  Displays appropriate judgment?Yes  Can read the correct time from a watch face?Yes  EOL planning: Does Patient Have a Medical Advance Directive?: Yes Type of Advance Directive: Healthcare Power of Attorney, Living will Does patient want to make changes to medical advance directive?: No - Patient declined Copy of [place] in Chart?: No - copy requested Would patient like information on creating a medical advance directive?: No - Patient declined  Review of Systems  Constitutional: Negative for malaise/fatigue and weight loss.  HENT: Negative for hearing loss and tinnitus.   Eyes: Negative for blurred vision and double vision.  Respiratory: Negative for cough, sputum production, shortness of breath and wheezing.   Cardiovascular: Negative for chest pain, palpitations, orthopnea, claudication, leg swelling and PND.  Gastrointestinal: Negative for abdominal pain, blood in stool, constipation, diarrhea, heartburn, melena, nausea and vomiting.  Genitourinary: Negative.   Musculoskeletal: Negative for falls, joint pain and myalgias.  Skin: Negative for rash.  Neurological: Negative for dizziness, tingling, sensory change, weakness and headaches.  Endo/Heme/Allergies: Negative for polydipsia.  Psychiatric/Behavioral: Negative.  Negative for depression, memory loss, substance abuse and suicidal ideas. The patient is not nervous/anxious and does not have insomnia.   All other systems reviewed  and are negative.    Objective:     Today's Vitals   09/02/19 1111  BP: 130/76  Pulse: 79  Temp: (!) 97.3 F (36.3 C)  SpO2: 97%  Weight: 168 lb 12.8 oz (76.6 kg)   Body mass index is 30.87 kg/m.  General appearance: alert, no distress, WD/WN, female HEENT: normocephalic, sclerae anicteric, TMs pearly, nares patent, no discharge or erythema, pharynx normal Oral cavity: MMM, no lesions Neck: supple, no lymphadenopathy, no thyromegaly, no masses Heart: RRR, normal S1, S2, no murmurs Lungs: CTA bilaterally, no wheezes, rhonchi, or rales Abdomen: +bs, soft, non tender, non distended, no masses, no hepatomegaly, no splenomegaly Musculoskeletal: nontender, no swelling, no obvious deformity Extremities: no edema, no cyanosis, no clubbing Pulses: 2+ symmetric, upper and lower extremities, normal cap refill Neurological: alert, oriented x 3, CN2-12 intact, strength normal upper extremities and lower extremities, sensation normal throughout, DTRs 2+ throughout, no cerebellar signs, gait normal Psychiatric: normal affect, behavior normal, pleasant  Medicare Attestation I have personally reviewed: The patient's medical and social history Their use of alcohol, tobacco or illicit drugs Their current medications and supplements The patient's functional ability including ADLs,fall risks, home safety risks, cognitive, and hearing and visual impairment Diet and physical activities Evidence for depression or mood disorders  The patient's weight, height, BMI, and visual acuity have been recorded in the chart.  I have made referrals, counseling, and provided education to the patient based on review of the above and I have provided the patient with a written personalized care plan for preventive services.     Izora Ribas, NP   09/02/2019

## 2019-09-02 ENCOUNTER — Ambulatory Visit (INDEPENDENT_AMBULATORY_CARE_PROVIDER_SITE_OTHER): Payer: Medicare Other | Admitting: Adult Health

## 2019-09-02 ENCOUNTER — Other Ambulatory Visit: Payer: Self-pay

## 2019-09-02 ENCOUNTER — Encounter: Payer: Self-pay | Admitting: Adult Health

## 2019-09-02 VITALS — BP 130/76 | HR 79 | Temp 97.3°F | Wt 168.8 lb

## 2019-09-02 DIAGNOSIS — E559 Vitamin D deficiency, unspecified: Secondary | ICD-10-CM | POA: Diagnosis not present

## 2019-09-02 DIAGNOSIS — Z950 Presence of cardiac pacemaker: Secondary | ICD-10-CM

## 2019-09-02 DIAGNOSIS — E782 Mixed hyperlipidemia: Secondary | ICD-10-CM

## 2019-09-02 DIAGNOSIS — Z0000 Encounter for general adult medical examination without abnormal findings: Secondary | ICD-10-CM

## 2019-09-02 DIAGNOSIS — Z23 Encounter for immunization: Secondary | ICD-10-CM

## 2019-09-02 DIAGNOSIS — R7309 Other abnormal glucose: Secondary | ICD-10-CM

## 2019-09-02 DIAGNOSIS — R6889 Other general symptoms and signs: Secondary | ICD-10-CM

## 2019-09-02 DIAGNOSIS — Z0001 Encounter for general adult medical examination with abnormal findings: Secondary | ICD-10-CM | POA: Diagnosis not present

## 2019-09-02 DIAGNOSIS — E669 Obesity, unspecified: Secondary | ICD-10-CM

## 2019-09-02 DIAGNOSIS — Z85828 Personal history of other malignant neoplasm of skin: Secondary | ICD-10-CM

## 2019-09-02 DIAGNOSIS — Z1159 Encounter for screening for other viral diseases: Secondary | ICD-10-CM

## 2019-09-02 DIAGNOSIS — I10 Essential (primary) hypertension: Secondary | ICD-10-CM

## 2019-09-02 DIAGNOSIS — I2510 Atherosclerotic heart disease of native coronary artery without angina pectoris: Secondary | ICD-10-CM

## 2019-09-02 DIAGNOSIS — I442 Atrioventricular block, complete: Secondary | ICD-10-CM | POA: Diagnosis not present

## 2019-09-02 NOTE — Patient Instructions (Addendum)
Ms. Tibbs , Thank you for taking time to come for your Medicare Wellness Visit. I appreciate your ongoing commitment to your health goals. Please review the following plan we discussed and let me know if I can assist you in the future.   These are the goals we discussed: Goals     Blood Pressure < 130/80     Exercise 150 min/wk Moderate Activity     LDL CALC < 70     Weight (lb) < 150 lb (68 kg)       This is a list of the screening recommended for you and due dates:  Health Maintenance  Topic Date Due    Hepatitis C: One time screening is recommended by Center for Disease Control  (CDC) for  adults born from [birth year] through 1967.   Never done   Pneumonia vaccines (2 of 2 - PPSV23) 04/13/2019   Tetanus Vaccine  09/01/2020*   Flu Shot  10/11/2019   COVID-19 Vaccine  Completed   DEXA scan (bone density measurement)  Discontinued  *Topic was postponed. The date shown is not the original due date.       Pneumococcal Vaccine, Polyvalent solution for injection What is this medicine? PNEUMOCOCCAL VACCINE, POLYVALENT (NEU mo KOK al vak SEEN, pol ee VEY luhnt) is a vaccine to prevent pneumococcus bacteria infection. These bacteria are a major cause of ear infections, Strep throat infections, and serious pneumonia, meningitis, or blood infections worldwide. These vaccines help the body to produce antibodies (protective substances) that help your body defend against these bacteria. This vaccine is recommended for people 44 years of age and older with health problems. It is also recommended for all adults over 68 years old. This vaccine will not treat an infection. This medicine may be used for other purposes; ask your health care provider or pharmacist if you have questions. COMMON BRAND NAME(S): Pneumovax 23 What should I tell my health care provider before I take this medicine? They need to know if you have any of these conditions:  bleeding problems  bone marrow or organ  transplant  cancer, Hodgkin's disease  fever  infection  immune system problems  low platelet count in the blood  seizures  an unusual or allergic reaction to pneumococcal vaccine, diphtheria toxoid, other vaccines, latex, other medicines, foods, dyes, or preservatives  pregnant or trying to get pregnant  breast-feeding How should I use this medicine? This vaccine is for injection into a muscle or under the skin. It is given by a health care professional. A copy of Vaccine Information Statements will be given before each vaccination. Read this sheet carefully each time. The sheet may change frequently. Talk to your pediatrician regarding the use of this medicine in children. While this drug may be prescribed for children as young as 68 years of age for selected conditions, precautions do apply. Overdosage: If you think you have taken too much of this medicine contact a poison control center or emergency room at once. NOTE: This medicine is only for you. Do not share this medicine with others. What if I miss a dose? It is important not to miss your dose. Call your doctor or health care professional if you are unable to keep an appointment. What may interact with this medicine?  medicines for cancer chemotherapy  medicines that suppress your immune function  medicines that treat or prevent blood clots like warfarin, enoxaparin, and dalteparin  steroid medicines like prednisone or cortisone This list may not describe all  possible interactions. Give your health care provider a list of all the medicines, herbs, non-prescription drugs, or dietary supplements you use. Also tell them if you smoke, drink alcohol, or use illegal drugs. Some items may interact with your medicine. What should I watch for while using this medicine? Mild fever and pain should go away in 3 days or less. Report any unusual symptoms to your doctor or health care professional. What side effects may I notice from  receiving this medicine? Side effects that you should report to your doctor or health care professional as soon as possible:  allergic reactions like skin rash, itching or hives, swelling of the face, lips, or tongue  breathing problems  confused  fever over 102 degrees F  pain, tingling, numbness in the hands or feet  seizures  unusual bleeding or bruising  unusual muscle weakness Side effects that usually do not require medical attention (report to your doctor or health care professional if they continue or are bothersome):  aches and pains  diarrhea  fever of 102 degrees F or less  headache  irritable  loss of appetite  pain, tender at site where injected  trouble sleeping This list may not describe all possible side effects. Call your doctor for medical advice about side effects. You may report side effects to FDA at 1-800-FDA-1088. Where should I keep my medicine? This does not apply. This vaccine is given in a clinic, pharmacy, doctor's office, or other health care setting and will not be stored at home. NOTE: This sheet is a summary. It may not cover all possible information. If you have questions about this medicine, talk to your doctor, pharmacist, or health care provider.  2020 Elsevier/Gold Standard (2007-10-03 14:32:37)

## 2019-09-03 ENCOUNTER — Other Ambulatory Visit: Payer: Self-pay | Admitting: Adult Health

## 2019-09-03 LAB — CBC WITH DIFFERENTIAL/PLATELET
Absolute Monocytes: 684 cells/uL (ref 200–950)
Basophils Absolute: 69 cells/uL (ref 0–200)
Basophils Relative: 1.3 %
Eosinophils Absolute: 435 cells/uL (ref 15–500)
Eosinophils Relative: 8.2 %
HCT: 41.5 % (ref 35.0–45.0)
Hemoglobin: 13.9 g/dL (ref 11.7–15.5)
Lymphs Abs: 1320 cells/uL (ref 850–3900)
MCH: 30.3 pg (ref 27.0–33.0)
MCHC: 33.5 g/dL (ref 32.0–36.0)
MCV: 90.4 fL (ref 80.0–100.0)
MPV: 10.6 fL (ref 7.5–12.5)
Monocytes Relative: 12.9 %
Neutro Abs: 2793 cells/uL (ref 1500–7800)
Neutrophils Relative %: 52.7 %
Platelets: 235 10*3/uL (ref 140–400)
RBC: 4.59 10*6/uL (ref 3.80–5.10)
RDW: 12.3 % (ref 11.0–15.0)
Total Lymphocyte: 24.9 %
WBC: 5.3 10*3/uL (ref 3.8–10.8)

## 2019-09-03 LAB — LIPID PANEL
Cholesterol: 168 mg/dL (ref <200)
HDL: 69 mg/dL (ref >=50)
LDL Cholesterol (Calc): 78 mg/dL (calc)
Non-HDL Cholesterol (Calc): 99 mg/dL (calc) (ref <130)
Total CHOL/HDL Ratio: 2.4 (calc) (ref <5.0)
Triglycerides: 119 mg/dL (ref <150)

## 2019-09-03 LAB — COMPLETE METABOLIC PANEL WITH GFR
AG Ratio: 2.1 (calc) (ref 1.0–2.5)
ALT: 13 U/L (ref 6–29)
AST: 17 U/L (ref 10–35)
Albumin: 4.5 g/dL (ref 3.6–5.1)
Alkaline phosphatase (APISO): 73 U/L (ref 37–153)
BUN: 9 mg/dL (ref 7–25)
CO2: 28 mmol/L (ref 20–32)
Calcium: 9.6 mg/dL (ref 8.6–10.4)
Chloride: 104 mmol/L (ref 98–110)
Creat: 0.76 mg/dL (ref 0.60–0.93)
GFR, Est African American: 88 mL/min/{1.73_m2} (ref >=60)
GFR, Est Non African American: 76 mL/min/{1.73_m2} (ref >=60)
Globulin: 2.1 g/dL (calc) (ref 1.9–3.7)
Glucose, Bld: 102 mg/dL — ABNORMAL HIGH (ref 65–99)
Potassium: 4.5 mmol/L (ref 3.5–5.3)
Sodium: 140 mmol/L (ref 135–146)
Total Bilirubin: 0.8 mg/dL (ref 0.2–1.2)
Total Protein: 6.6 g/dL (ref 6.1–8.1)

## 2019-09-03 LAB — TSH: TSH: 2.46 mIU/L (ref 0.40–4.50)

## 2019-09-03 LAB — HEPATITIS C ANTIBODY
Hepatitis C Ab: NONREACTIVE
SIGNAL TO CUT-OFF: 0 (ref <1.00)

## 2019-09-03 MED ORDER — ROSUVASTATIN CALCIUM 20 MG PO TABS: 20.0000 mg | ORAL_TABLET | Freq: Every day | ORAL | 1 refills | Status: DC

## 2019-09-16 LAB — CUP PACEART INCLINIC DEVICE CHECK
Battery Remaining Longevity: 137 mo
Battery Voltage: 3.02 V
Brady Statistic AP VP Percent: 2.27 %
Brady Statistic AP VS Percent: 0 %
Brady Statistic AS VP Percent: 97.7 %
Brady Statistic AS VS Percent: 0.03 %
Brady Statistic RA Percent Paced: 2.27 %
Brady Statistic RV Percent Paced: 99.97 %
Date Time Interrogation Session: 20210526151835
Implantable Lead Implant Date: 20200102
Implantable Lead Implant Date: 20200102
Implantable Lead Location: 753859
Implantable Lead Location: 753860
Implantable Lead Model: 4076
Implantable Lead Model: 4076
Implantable Pulse Generator Implant Date: 20200102
Lead Channel Impedance Value: 361 Ohm
Lead Channel Impedance Value: 418 Ohm
Lead Channel Impedance Value: 456 Ohm
Lead Channel Impedance Value: 513 Ohm
Lead Channel Pacing Threshold Amplitude: 0.5 V
Lead Channel Pacing Threshold Amplitude: 0.625 V
Lead Channel Pacing Threshold Pulse Width: 0.4 ms
Lead Channel Pacing Threshold Pulse Width: 0.4 ms
Lead Channel Sensing Intrinsic Amplitude: 12 mV
Lead Channel Sensing Intrinsic Amplitude: 12 mV
Lead Channel Sensing Intrinsic Amplitude: 3.875 mV
Lead Channel Setting Pacing Amplitude: 1.5 V
Lead Channel Setting Pacing Amplitude: 2 V
Lead Channel Setting Pacing Pulse Width: 0.4 ms
Lead Channel Setting Sensing Sensitivity: 2 mV

## 2019-10-12 ENCOUNTER — Other Ambulatory Visit: Payer: Self-pay

## 2019-10-12 MED ORDER — ROSUVASTATIN CALCIUM 20 MG PO TABS: 20.0000 mg | ORAL_TABLET | Freq: Every day | ORAL | 1 refills | Status: DC

## 2019-11-24 ENCOUNTER — Encounter: Payer: Medicare Other | Admitting: Internal Medicine

## 2019-11-27 ENCOUNTER — Ambulatory Visit (INDEPENDENT_AMBULATORY_CARE_PROVIDER_SITE_OTHER): Payer: Medicare Other | Admitting: *Deleted

## 2019-11-27 DIAGNOSIS — I442 Atrioventricular block, complete: Secondary | ICD-10-CM | POA: Diagnosis not present

## 2019-11-29 LAB — CUP PACEART REMOTE DEVICE CHECK
Battery Remaining Longevity: 131 mo
Battery Voltage: 3.02 V
Brady Statistic AP VP Percent: 1.32 %
Brady Statistic AP VS Percent: 0 %
Brady Statistic AS VP Percent: 98.65 %
Brady Statistic AS VS Percent: 0.04 %
Brady Statistic RA Percent Paced: 1.32 %
Brady Statistic RV Percent Paced: 99.96 %
Date Time Interrogation Session: 20210917050025
Implantable Lead Implant Date: 20200102
Implantable Lead Implant Date: 20200102
Implantable Lead Location: 753859
Implantable Lead Location: 753860
Implantable Lead Model: 4076
Implantable Lead Model: 4076
Implantable Pulse Generator Implant Date: 20200102
Lead Channel Impedance Value: 361 Ohm
Lead Channel Impedance Value: 399 Ohm
Lead Channel Impedance Value: 399 Ohm
Lead Channel Impedance Value: 456 Ohm
Lead Channel Pacing Threshold Amplitude: 0.5 V
Lead Channel Pacing Threshold Amplitude: 0.625 V
Lead Channel Pacing Threshold Pulse Width: 0.4 ms
Lead Channel Pacing Threshold Pulse Width: 0.4 ms
Lead Channel Sensing Intrinsic Amplitude: 12 mV
Lead Channel Sensing Intrinsic Amplitude: 12 mV
Lead Channel Sensing Intrinsic Amplitude: 3.375 mV
Lead Channel Sensing Intrinsic Amplitude: 3.375 mV
Lead Channel Setting Pacing Amplitude: 1.5 V
Lead Channel Setting Pacing Amplitude: 2 V
Lead Channel Setting Pacing Pulse Width: 0.4 ms
Lead Channel Setting Sensing Sensitivity: 2 mV

## 2019-11-30 NOTE — Progress Notes (Signed)
Remote pacemaker transmission.   

## 2019-12-02 ENCOUNTER — Encounter: Payer: Self-pay | Admitting: Internal Medicine

## 2019-12-02 NOTE — Progress Notes (Addendum)
     R  E  S  C  H  E  D  U  L  E  D  Morning of App't  

## 2019-12-03 ENCOUNTER — Ambulatory Visit: Payer: Medicare Other | Admitting: Internal Medicine

## 2019-12-11 ENCOUNTER — Other Ambulatory Visit: Payer: Self-pay | Admitting: Internal Medicine

## 2019-12-11 ENCOUNTER — Encounter: Payer: Self-pay | Admitting: Internal Medicine

## 2019-12-11 ENCOUNTER — Ambulatory Visit (INDEPENDENT_AMBULATORY_CARE_PROVIDER_SITE_OTHER): Payer: Medicare Other | Admitting: Internal Medicine

## 2019-12-11 ENCOUNTER — Other Ambulatory Visit: Payer: Self-pay

## 2019-12-11 VITALS — BP 110/74 | HR 86 | Temp 97.0°F | Resp 16 | Ht 62.0 in | Wt 170.0 lb

## 2019-12-11 DIAGNOSIS — I10 Essential (primary) hypertension: Secondary | ICD-10-CM | POA: Diagnosis not present

## 2019-12-11 DIAGNOSIS — E559 Vitamin D deficiency, unspecified: Secondary | ICD-10-CM

## 2019-12-11 DIAGNOSIS — I2510 Atherosclerotic heart disease of native coronary artery without angina pectoris: Secondary | ICD-10-CM

## 2019-12-11 DIAGNOSIS — E782 Mixed hyperlipidemia: Secondary | ICD-10-CM

## 2019-12-11 DIAGNOSIS — Z950 Presence of cardiac pacemaker: Secondary | ICD-10-CM | POA: Diagnosis not present

## 2019-12-11 DIAGNOSIS — I442 Atrioventricular block, complete: Secondary | ICD-10-CM | POA: Diagnosis not present

## 2019-12-11 DIAGNOSIS — Z1231 Encounter for screening mammogram for malignant neoplasm of breast: Secondary | ICD-10-CM

## 2019-12-11 DIAGNOSIS — R7309 Other abnormal glucose: Secondary | ICD-10-CM

## 2019-12-11 DIAGNOSIS — Z136 Encounter for screening for cardiovascular disorders: Secondary | ICD-10-CM

## 2019-12-11 DIAGNOSIS — Z1211 Encounter for screening for malignant neoplasm of colon: Secondary | ICD-10-CM

## 2019-12-11 DIAGNOSIS — Z79899 Other long term (current) drug therapy: Secondary | ICD-10-CM

## 2019-12-11 NOTE — Patient Instructions (Signed)

## 2019-12-11 NOTE — Progress Notes (Signed)
Comprehensive Evaluation &  Examination       This very nice 74 y.o. MWF  presents for a comprehensive evaluation and management of multiple medical co-morbidities.  Patient has been followed for HTN, HLD, T2_NIDDM  Prediabetes  and Vitamin D Deficiency.              HTN predates since  2000.  Patient has hx/o ASCAD s/p Stent in Mar 2020/ and also has a PPM  (Jan 2020) for hx/o CHB.  She is followed by Dr Rayann Heman for her PPM. Patient's BP has been controlled at home and patient denies any cardiac symptoms as chest pain, palpitations, shortness of breath, dizziness or ankle swelling.  Today's BP is at goal - 110/74.        Patient's hyperlipidemia is controlled with diet and medications. Patient denies myalgias or other medication SE's. Last lipids were at goal:  Lab Results  Component Value Date   CHOL 168 09/02/2019   HDL 69 09/02/2019   LDLCALC 78 09/02/2019   TRIG 119 09/02/2019   CHOLHDL 2.4 09/02/2019       Patient is monitored expectantly for Glucose Intolerance  and patient denies reactive hypoglycemic symptoms, visual blurring, diabetic polys or paresthesias. Last A1c was Normal & at goal:  Lab Results  Component Value Date   HGBA1C 5.4 05/26/2019       Finally, patient has history of Vitamin D Deficiency  ("32" /Sept 2020)and last Vitamin D was still very low :  Lab Results  Component Value Date   VD25OH 36 05/26/2019    Current Outpatient Medications on File Prior to Visit  Medication Sig  . amLODipine  5 MG tablet Take 5 mg daily.  Marland Kitchen VITAMIN C Take 1,000 mg  daily.  Marland Kitchen aspirin EC 81 MG tablet Take  daily.  . CHOLECALCIFEROL Take 10,000 Units daily.  . Lutein 20 MG CAPS Take 1 capsule  daily.  . metoprolol succ-XL) 25 MG  Take 25 mg  daily.  . rosuvastatin 20 MG tablet Take 1 tablet  daily.  Marland Kitchen zinc 50 MG tablet Take  daily.    Allergies  Allergen Reactions  . Penicillins    Past Medical History:  Diagnosis Date  . Abnormal glucose 11/18/2018  . Allergy     penicillin  . Arteriosclerotic heart disease (ASHD) 11/18/2018   s/p PCI 05/2018  . Cardiac pacemaker 11/18/2018  . Complete heart block ([place]) 11/18/2018  . Essential hypertension 11/18/2018  . Hyperlipidemia 2010  . Hypertension 2000  . Osteoporosis    Health Maintenance  Topic Date Due  . INFLUENZA VACCINE  10/11/2019  . TETANUS/TDAP  09/01/2020 (Originally 05/24/1964)  . COVID-19 Vaccine  Completed  . Hepatitis C Screening  Completed  . PNA vac Low Risk Adult  Completed  . DEXA SCAN  Discontinued   Immunization History  Administered Date(s) Administered  . Fluad Quad(high Dose 65+) 12/30/2018  . PFIZER SARS-COV-2 Vaccination 04/17/2019, 05/12/2019  . Pneumococcal Conjugate-13 04/12/2018  . Pneumococcal Polysaccharide-23 09/02/2019  . Zoster Recombinat (Shingrix) 02/09/2018, 04/29/2018    Last Colon - 02/2017 in [place] and no f/u recc due to age.  Last MGM -  01/05/2019  Past Surgical History:  Procedure Laterality Date  . CHOLECYSTECTOMY  2011  . CORONARY ANGIOPLASTY WITH STENT PLACEMENT  05/2018  . mohl  2009   face  . PACEMAKER IMPLANT  03/2018   MDT pacemaker implanted in [place] Right 2016   Family  History  Problem Relation Age of Onset  . Colon cancer Mother   . Diabetes Father   . Hypertension Brother    Social History   Tobacco Use  . Smoking status: Never Smoker  . Smokeless tobacco: Never Used  Substance Use Topics  . Alcohol use: Not on file  . Drug use: Never    ROS Constitutional: Denies fever, chills, weight loss/gain, headaches, insomnia,  night sweats, and change in appetite. Does c/o fatigue. Eyes: Denies redness, blurred vision, diplopia, discharge, itchy, watery eyes.  ENT: Denies discharge, congestion, post nasal drip, epistaxis, sore throat, earache, hearing loss, dental pain, Tinnitus, Vertigo, Sinus pain, snoring.  Cardio: Denies chest pain, palpitations, irregular heartbeat, syncope, dyspnea, diaphoresis, orthopnea, PND,  claudication, edema Respiratory: denies cough, dyspnea, DOE, pleurisy, hoarseness, laryngitis, wheezing.  Gastrointestinal: Denies dysphagia, heartburn, reflux, water brash, pain, cramps, nausea, vomiting, bloating, diarrhea, constipation, hematemesis, melena, hematochezia, jaundice, hemorrhoids Genitourinary: Denies dysuria, frequency, urgency, nocturia, hesitancy, discharge, hematuria, flank pain Breast: Breast lumps, nipple discharge, bleeding.  Musculoskeletal: Denies arthralgia, myalgia, stiffness, Jt. Swelling, pain, limp, and strain/sprain. Denies falls. Skin: Denies puritis, rash, hives, warts, acne, eczema, changing in skin lesion Neuro: No weakness, tremor, incoordination, spasms, paresthesia, pain Psychiatric: Denies confusion, memory loss, sensory loss. Denies Depression. Endocrine: Denies change in weight, skin, hair change, nocturia, and paresthesia, diabetic polys, visual blurring, hyper / hypo glycemic episodes.  Heme/Lymph: No excessive bleeding, bruising, enlarged lymph nodes.  Physical Exam  BP 110/74   Pulse 86   Temp (!) 97 F (36.1 C)   Resp 16   Ht 5\' 2"  (1.575 m)   Wt 170 lb (77.1 kg)   SpO2 98%   BMI 31.09 kg/m   General Appearance: Well nourished, well groomed and in no apparent distress.  Eyes: PERRLA, EOMs, conjunctiva no swelling or erythema, normal fundi and vessels. Sinuses: No frontal/maxillary tenderness ENT/Mouth: EACs patent / TMs  nl. Nares clear without erythema, swelling, mucoid exudates. Oral hygiene is good. No erythema, swelling, or exudate. Tongue normal, non-obstructing. Tonsils not swollen or erythematous. Hearing normal.  Neck: Supple, thyroid not palpable. No bruits, nodes or JVD. Respiratory: Respiratory effort normal.  BS equal and clear bilateral without rales, rhonci, wheezing or stridor. Cardio: Heart sounds are normal with regular rate and rhythm and no murmurs, rubs or gallops. Peripheral pulses are normal and equal bilaterally  without edema. No aortic or femoral bruits. Chest: symmetric with normal excursions and percussion. Breasts: Symmetric, without lumps, nipple discharge, retractions, or fibrocystic changes.  Abdomen: Flat, soft with bowel sounds active. Nontender, no guarding, rebound, hernias, masses, or organomegaly.  Lymphatics: Non tender without lymphadenopathy.  Genitourinary:  Musculoskeletal: Full ROM all peripheral extremities, joint stability, 5/5 strength, and normal gait. Skin: Warm and dry without rashes, lesions, cyanosis, clubbing or  ecchymosis.  Neuro: Cranial nerves intact, reflexes equal bilaterally. Normal muscle tone, no cerebellar symptoms. Sensation intact.  Pysch: Alert and oriented X 3, normal affect, Insight and Judgment appropriate.   Assessment and Plan   1. Essential hypertension  - EKG 12-Lead - Urinalysis, Routine w reflex microscopic - Microalbumin / creatinine urine ratio - CBC with Differential/Platelet - COMPLETE METABOLIC PANEL WITH GFR - Magnesium - TSH  2. Hyperlipidemia, mixed  - EKG 12-Lead - Lipid panel - TSH  3. Abnormal glucose  - EKG 12-Lead - Hemoglobin A1c - Insulin, random  4. Vitamin D deficiency  - VITAMIN D 25 Hydroxy  5. Arteriosclerotic heart disease (ASHD)  - EKG 12-Lead - Lipid panel  6. Complete heart block (H[facility])  - EKG 12-Lead  7. Cardiac pacemaker  - EKG 12-Lead  8. Screening for colorectal cancer  - POC Hemoccult Bld/Stl   9. Screening for ischemic heart disease  - EKG 12-Lead  10. Medication management  - Urinalysis, Routine w reflex microscopic - Microalbumin / creatinine urine ratio - CBC with Differential/Platelet - COMPLETE METABOLIC PANEL WITH GFR - Magnesium - Lipid panel - TSH - Hemoglobin A1c - Insulin, random - VITAMIN D 25 Hydroxy          Patient was counseled in prudent diet to achieve/maintain BMI less than 25 for weight control, BP monitoring, regular exercise and medications. Discussed  med's effects and SE's. Screening labs and tests as requested with regular follow-up as recommended. Over 40 minutes of exam, counseling, chart review and high complex critical decision making was performed.   Kirtland Bouchard, MD

## 2019-12-12 NOTE — Progress Notes (Signed)
========================================================== -   Test results slightly outside the reference range are not unusual. If there is anything important, I will review this with you,  otherwise it is considered normal test values.  If you have further questions,  please do not hesitate to contact me at the office or via My Chart.  ==========================================================  -  Total chol = 157 and LDL Chol = 71 - Both  Excellent  - Very low risk for Heart Attack  / Stroke =============================================================  - A1c = Normal - great - No Diabetes ==========================================================  -  Vitamin D = 70 - Excellent   - Vitamin D goal is between 70-100.   - Please Keep taking your Vitamin D same  - It is very important as a natural anti-inflammatory and helping the  immune system protect against viral infections, like the Covid-19    helping hair, skin, and nails, as well as reducing stroke and  heart attack risk.   - It helps your bones and helps with mood.  - It also decreases numerous cancer risks so please take  it as directed.   - Low Vit D is associated with a 200-300% higher risk for CANCER   and 200-300% higher risk for HEART   ATTACK  &  STROKE.    - It is also associated with higher death rate at younger ages,   autoimmune diseases like Rheumatoid arthritis, Lupus,  Multiple Sclerosis.     - Also many other serious conditions, like depression, Alzheimer's  Dementia, infertility, muscle aches, fatigue, fibromyalgia -  just to name a few.  =========================================================  - All Else - CBC - Kidneys - Electrolytes - Liver - Magnesium & Thyroid    - all  Normal / OK ==========================================================   -Keep up the [place] Work !  ==========================================================

## 2019-12-14 LAB — CBC WITH DIFFERENTIAL/PLATELET
Absolute Monocytes: 645 cells/uL (ref 200–950)
Basophils Absolute: 57 cells/uL (ref 0–200)
Basophils Relative: 1.1 %
Eosinophils Absolute: 478 cells/uL (ref 15–500)
Eosinophils Relative: 9.2 %
HCT: 42.2 % (ref 35.0–45.0)
Hemoglobin: 14 g/dL (ref 11.7–15.5)
Lymphs Abs: 1321 cells/uL (ref 850–3900)
MCH: 30 pg (ref 27.0–33.0)
MCHC: 33.2 g/dL (ref 32.0–36.0)
MCV: 90.6 fL (ref 80.0–100.0)
MPV: 10.5 fL (ref 7.5–12.5)
Monocytes Relative: 12.4 %
Neutro Abs: 2699 cells/uL (ref 1500–7800)
Neutrophils Relative %: 51.9 %
Platelets: 211 10*3/uL (ref 140–400)
RBC: 4.66 10*6/uL (ref 3.80–5.10)
RDW: 12.4 % (ref 11.0–15.0)
Total Lymphocyte: 25.4 %
WBC: 5.2 10*3/uL (ref 3.8–10.8)

## 2019-12-14 LAB — URINALYSIS, ROUTINE W REFLEX MICROSCOPIC
Bacteria, UA: NONE SEEN /HPF
Bilirubin Urine: NEGATIVE
Glucose, UA: NEGATIVE
Hgb urine dipstick: NEGATIVE
Hyaline Cast: NONE SEEN /LPF
Nitrite: NEGATIVE
Protein, ur: NEGATIVE
RBC / HPF: NONE SEEN /HPF (ref 0–2)
Specific Gravity, Urine: 1.012 (ref 1.001–1.03)
Squamous Epithelial / HPF: NONE SEEN /HPF (ref <=5)
WBC, UA: NONE SEEN /HPF (ref 0–5)
pH: 5.5 (ref 5.0–8.0)

## 2019-12-14 LAB — COMPLETE METABOLIC PANEL WITH GFR
AG Ratio: 2.4 (calc) (ref 1.0–2.5)
ALT: 14 U/L (ref 6–29)
AST: 17 U/L (ref 10–35)
Albumin: 4.5 g/dL (ref 3.6–5.1)
Alkaline phosphatase (APISO): 69 U/L (ref 37–153)
BUN: 12 mg/dL (ref 7–25)
CO2: 28 mmol/L (ref 20–32)
Calcium: 9.6 mg/dL (ref 8.6–10.4)
Chloride: 104 mmol/L (ref 98–110)
Creat: 0.78 mg/dL (ref 0.60–0.93)
GFR, Est African American: 86 mL/min/{1.73_m2} (ref >=60)
GFR, Est Non African American: 74 mL/min/{1.73_m2} (ref >=60)
Globulin: 1.9 g/dL (calc) (ref 1.9–3.7)
Glucose, Bld: 96 mg/dL (ref 65–99)
Potassium: 4.8 mmol/L (ref 3.5–5.3)
Sodium: 139 mmol/L (ref 135–146)
Total Bilirubin: 0.9 mg/dL (ref 0.2–1.2)
Total Protein: 6.4 g/dL (ref 6.1–8.1)

## 2019-12-14 LAB — VITAMIN D 25 HYDROXY (VIT D DEFICIENCY, FRACTURES): Vit D, 25-Hydroxy: 70 ng/mL (ref 30–100)

## 2019-12-14 LAB — MAGNESIUM: Magnesium: 2.1 mg/dL (ref 1.5–2.5)

## 2019-12-14 LAB — LIPID PANEL
Cholesterol: 157 mg/dL (ref <200)
HDL: 62 mg/dL (ref >=50)
LDL Cholesterol (Calc): 71 mg/dL (calc)
Non-HDL Cholesterol (Calc): 95 mg/dL (calc) (ref <130)
Total CHOL/HDL Ratio: 2.5 (calc) (ref <5.0)
Triglycerides: 162 mg/dL — ABNORMAL HIGH (ref <150)

## 2019-12-14 LAB — TSH: TSH: 3.19 mIU/L (ref 0.40–4.50)

## 2019-12-14 LAB — MICROALBUMIN / CREATININE URINE RATIO
Creatinine, Urine: 98 mg/dL (ref 20–275)
Microalb Creat Ratio: 4 mcg/mg creat (ref <30)
Microalb, Ur: 0.4 mg/dL

## 2019-12-14 LAB — HEMOGLOBIN A1C
Hgb A1c MFr Bld: 5.6 % of total Hgb (ref <5.7)
Mean Plasma Glucose: 114 (calc)
eAG (mmol/L): 6.3 (calc)

## 2019-12-14 LAB — INSULIN, RANDOM: Insulin: 5.9 u[IU]/mL

## 2020-01-07 ENCOUNTER — Ambulatory Visit: Payer: Medicare Other

## 2020-02-06 ENCOUNTER — Other Ambulatory Visit: Payer: Self-pay | Admitting: Internal Medicine

## 2020-02-06 MED ORDER — DOXYCYCLINE HYCLATE 100 MG PO CAPS: ORAL_CAPSULE | ORAL | 0 refills | Status: DC

## 2020-02-06 MED ORDER — DOXYCYCLINE HYCLATE 100 MG PO CAPS
ORAL_CAPSULE | ORAL | 0 refills | Status: DC
Start: 2020-02-06 — End: 2020-03-17

## 2020-02-10 ENCOUNTER — Encounter: Payer: Self-pay | Admitting: Internal Medicine

## 2020-02-10 ENCOUNTER — Ambulatory Visit (INDEPENDENT_AMBULATORY_CARE_PROVIDER_SITE_OTHER): Payer: Medicare Other | Admitting: Internal Medicine

## 2020-02-10 ENCOUNTER — Other Ambulatory Visit: Payer: Self-pay

## 2020-02-10 VITALS — BP 140/86 | HR 91 | Temp 98.1°F | Ht 62.0 in | Wt 172.0 lb

## 2020-02-10 DIAGNOSIS — I2510 Atherosclerotic heart disease of native coronary artery without angina pectoris: Secondary | ICD-10-CM

## 2020-02-10 DIAGNOSIS — S91301A Unspecified open wound, right foot, initial encounter: Secondary | ICD-10-CM

## 2020-02-10 MED ORDER — CLOBETASOL PROPIONATE 0.05 % EX CREA: 1.0000 "application " | TOPICAL_CREAM | Freq: Two times a day (BID) | CUTANEOUS | 0 refills | Status: DC

## 2020-02-10 NOTE — Progress Notes (Signed)
   History of Present Illness:    Patient relates abrading Rt foot plantar metatarsal area with a pumice stone "too deeply" and the skin became "raw " & has been very slow to heal completely. Has tried antibiotic  ointment& Lotrimin cream. Was sent in Rx  for Doxycycline 4-5 days ago.   Medications  .  amLODipine (NORVASC) 5 MG tablet, Take 5 mg by mouth daily. .  metoprolol succinate (TOPROL-XL) 25 MG 24 hr tablet, Take 25 mg by mouth daily. .  rosuvastatin (CRESTOR) 20 MG tablet, Take 1 tablet (20 mg total) by mouth daily. Marland Kitchen  aspirin EC 81 MG tablet, Take 81 mg by mouth daily. .  Ascorbic Acid (VITAMIN C PO), Take 1,000 mg by mouth daily. .  CHOLECALCIFEROL PO, Take 10,000 Units by mouth daily. Marland Kitchen  doxycycline (VIBRAMYCIN) 100 MG capsule, Take 1 capsule 2 x /day with meals for Infection .  Lutein 20 MG CAPS, Take 1 capsule by mouth daily. Marland Kitchen  zinc gluconate 50 MG tablet, Take 50 mg by mouth daily.  Problem list She has Arteriosclerotic heart disease (ASHD); Cardiac pacemaker; Complete heart block ([place]); Vitamin D deficiency; Abnormal glucose; Essential hypertension; Hyperlipidemia; History of skin cancer; and Obesity (BMI 30.0-34.9) on their problem list.   Observations/Objective:   BP 140/86   P 91   T 98.1 F   Ht 5\' 2"     Wt 172 lb    SpO2 91%   BMI 31.46   HEENT - WNL. Neck - supple.  Chest - Clear equal BS. Cor - Nl HS. RRR w/o sig MGR. PP 1(+). No edema. MS- FROM w/o deformities.  Gait Nl. The is a hyperemic 4 cm cross diameter inflamed circumscribed area over the plantar  Metatarsal area of the plantar sole of the Rt foot with over lying dry scaly / flaking skin. No evidence of lymphangitis or purulence.   Neuro -  Nl w/o focal abnormalities.  Assessment and Plan:  1. Wound, foot, right  - clobetasol cream (TEMOVATE) 0.05 %; Applytopically 2 x /daily. Apply   To rash 2 to 3 x /day  Dispense: 60 g; Refill: 0  - Continue Lotrimin cream 2 x /dayand cover with  waterproof bandage  - complete antibiotic course  Follow Up Instructions:  I     discussed the assessment and treatment plan with the patient. The patient was provided an opportunity to ask questions and all were answered. The patient agreed with the plan and demonstrated an understanding of the instructions.       The patient was advised to call back or seek an in-person evaluation if the symptoms worsen or if the condition fails to improve as anticipated.   Kirtland Bouchard, MD

## 2020-02-11 ENCOUNTER — Ambulatory Visit
Admission: RE | Admit: 2020-02-11 | Discharge: 2020-02-11 | Disposition: A | Discharge status: Home / Self Care | Payer: Medicare Other | Source: Ambulatory Visit | Attending: Internal Medicine | Admitting: Internal Medicine

## 2020-02-11 DIAGNOSIS — Z1231 Encounter for screening mammogram for malignant neoplasm of breast: Secondary | ICD-10-CM

## 2020-02-11 IMAGING — MG DIGITAL SCREENING BILAT W/ TOMO W/ CAD
8 series · 8 of 24 positions shown · non-contrast
Comparison: Previous exam(s).

CLINICAL DATA: Screening.

EXAM:
DIGITAL SCREENING BILATERAL MAMMOGRAM WITH TOMO AND CAD

[R CC synth-2D]
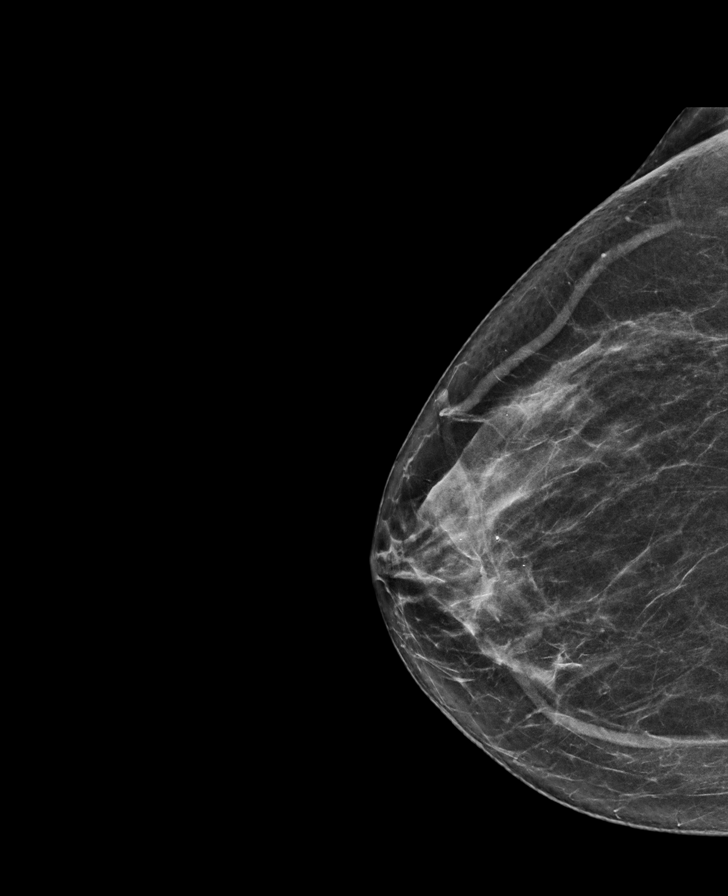

[L MLO synth-2D]
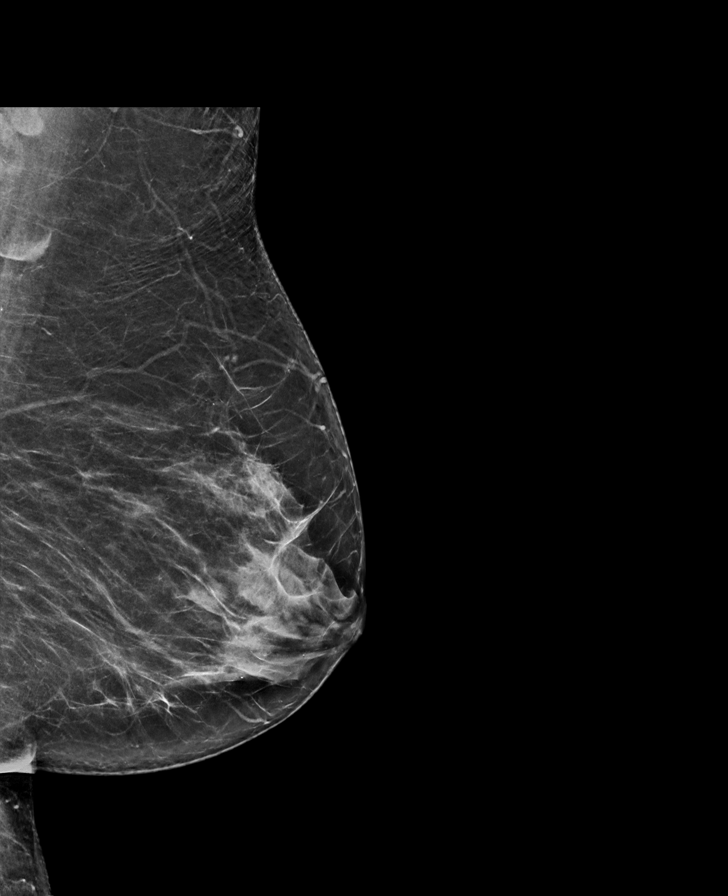

[R MLO synth-2D]
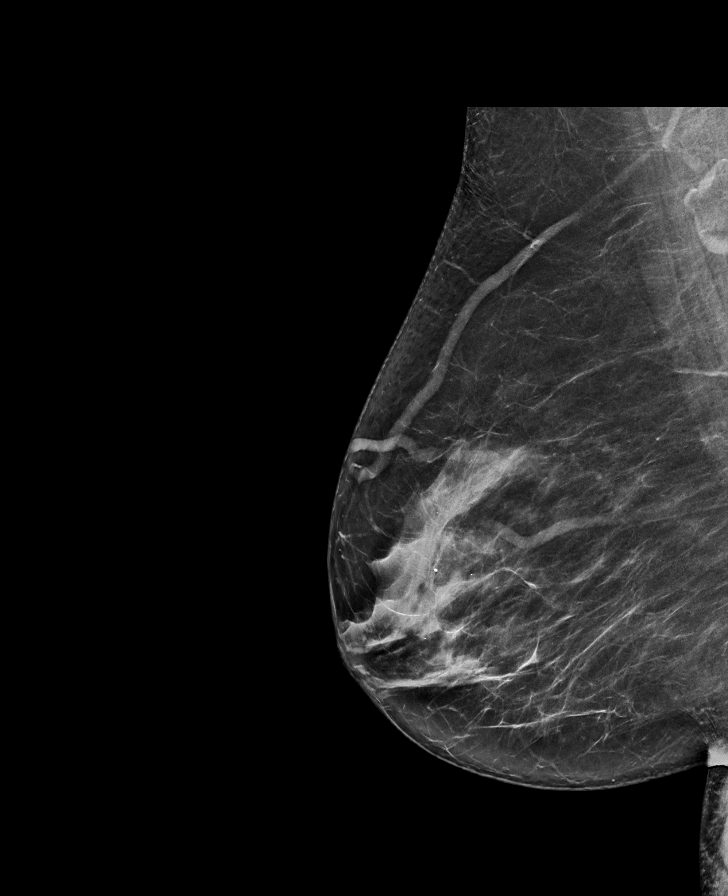

[L CC synth-2D]
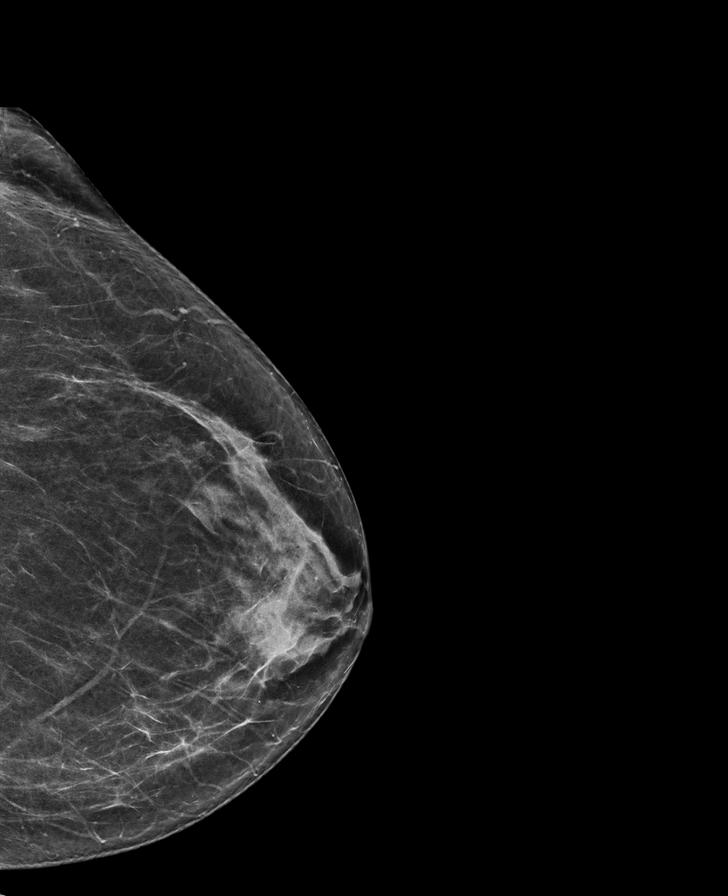

[R CC tomo · tomo slice 33/66.0]
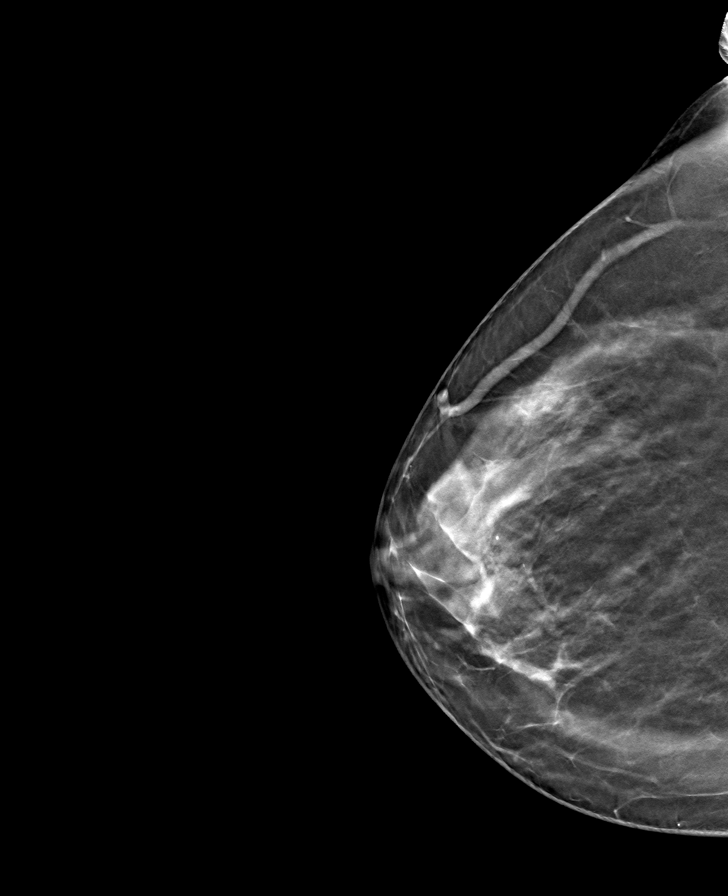

[R MLO tomo · tomo slice 38/75.0]
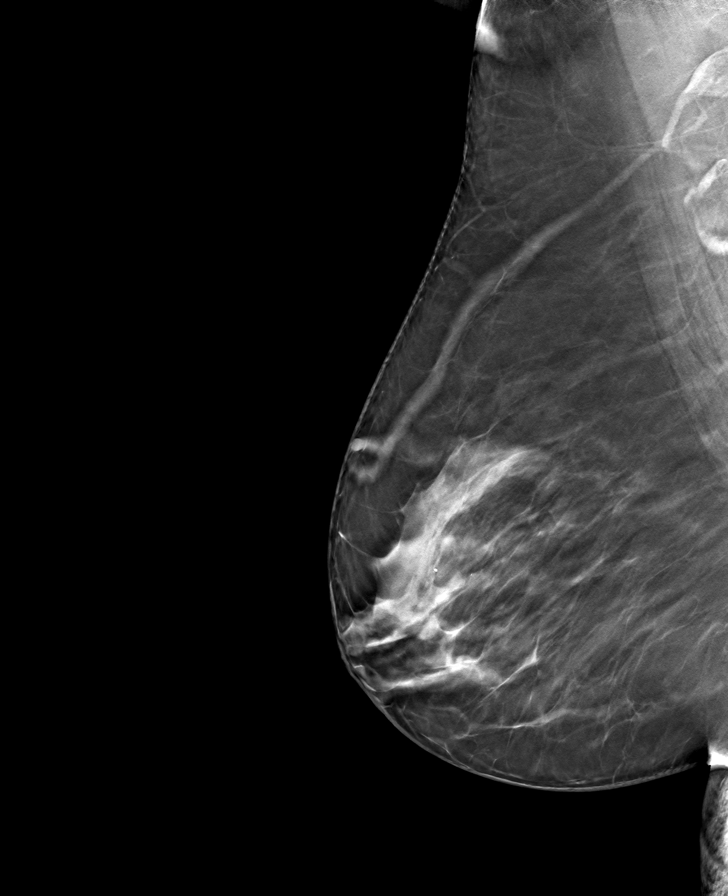

[L CC tomo · tomo slice 33/66.0]
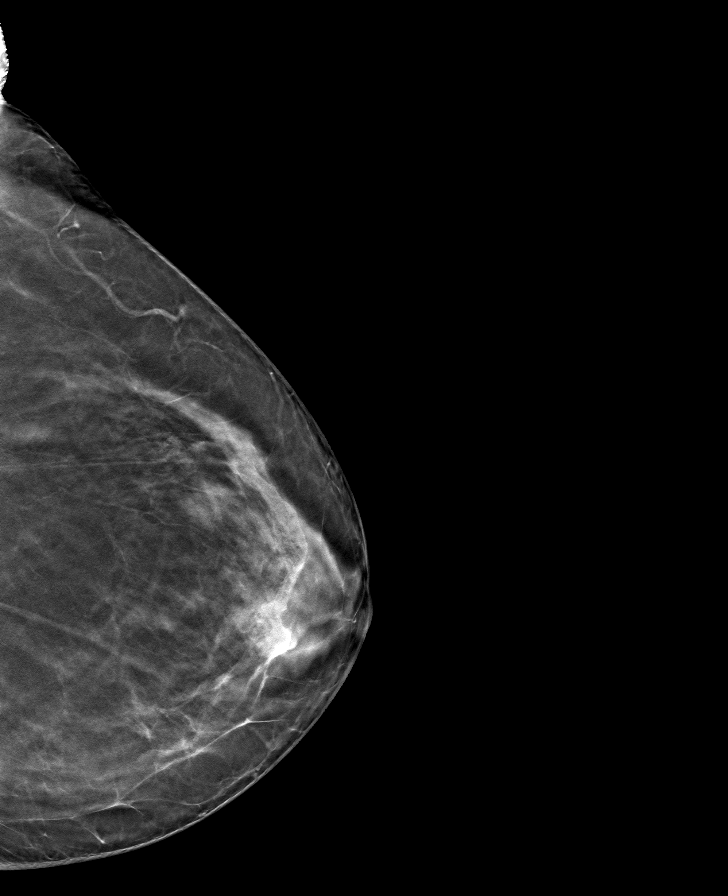

[L MLO tomo · tomo slice 37/74.0]
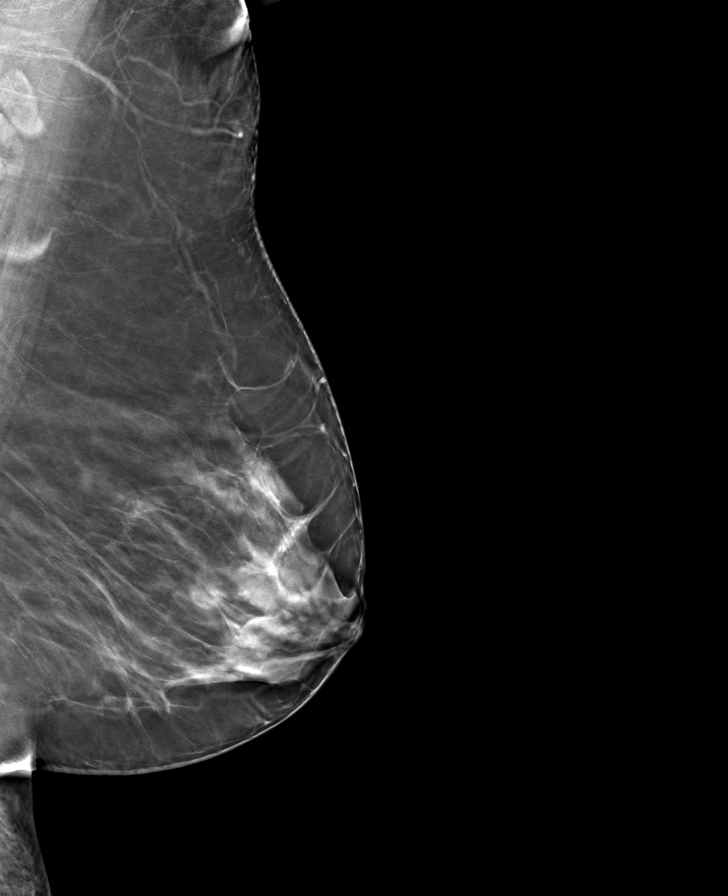

[8 of 24 positions shown; findings below may reference images not displayed]

ACR Breast Density Category b: There are scattered areas of
fibroglandular density.
FINDINGS: There are no findings suspicious for malignancy. Images were
processed with CAD.
IMPRESSION: No mammographic evidence of malignancy. A result letter of this
screening mammogram will be mailed directly to the patient.

RECOMMENDATION:
Screening mammogram in one year. (Code:[TQ])

BI-RADS CATEGORY  1: Negative.

## 2020-02-26 ENCOUNTER — Other Ambulatory Visit: Payer: Self-pay | Admitting: Adult Health

## 2020-02-26 ENCOUNTER — Ambulatory Visit (INDEPENDENT_AMBULATORY_CARE_PROVIDER_SITE_OTHER): Payer: Medicare Other

## 2020-02-26 DIAGNOSIS — I442 Atrioventricular block, complete: Secondary | ICD-10-CM

## 2020-02-26 LAB — CUP PACEART REMOTE DEVICE CHECK
Battery Remaining Longevity: 129 mo
Battery Voltage: 3.01 V
Brady Statistic AP VP Percent: 1.31 %
Brady Statistic AP VS Percent: 0 %
Brady Statistic AS VP Percent: 98.67 %
Brady Statistic AS VS Percent: 0.03 %
Brady Statistic RA Percent Paced: 1.31 %
Brady Statistic RV Percent Paced: 99.97 %
Date Time Interrogation Session: 20211216212827
Implantable Lead Implant Date: 20200102
Implantable Lead Implant Date: 20200102
Implantable Lead Location: 753859
Implantable Lead Location: 753860
Implantable Lead Model: 4076
Implantable Lead Model: 4076
Implantable Pulse Generator Implant Date: 20200102
Lead Channel Impedance Value: 361 Ohm
Lead Channel Impedance Value: 399 Ohm
Lead Channel Impedance Value: 418 Ohm
Lead Channel Impedance Value: 475 Ohm
Lead Channel Pacing Threshold Amplitude: 0.5 V
Lead Channel Pacing Threshold Amplitude: 0.625 V
Lead Channel Pacing Threshold Pulse Width: 0.4 ms
Lead Channel Pacing Threshold Pulse Width: 0.4 ms
Lead Channel Sensing Intrinsic Amplitude: 12 mV
Lead Channel Sensing Intrinsic Amplitude: 12 mV
Lead Channel Sensing Intrinsic Amplitude: 3.625 mV
Lead Channel Sensing Intrinsic Amplitude: 3.625 mV
Lead Channel Setting Pacing Amplitude: 1.5 V
Lead Channel Setting Pacing Amplitude: 2 V
Lead Channel Setting Pacing Pulse Width: 0.4 ms
Lead Channel Setting Sensing Sensitivity: 2 mV

## 2020-03-10 NOTE — Progress Notes (Signed)
Remote pacemaker transmission.   

## 2020-03-16 NOTE — Progress Notes (Signed)
3 MONTH FOLLOW UP  Assessment:    Complete heart block/ S/p pacemaker Now follows with Dr. Johney Frame Doing well s/p pacemaker Monitor   Arteriosclerotic heart disease S/p stent 05/2018; completed DAPT x 1 year, now on bASA Denies CP Follows with cardiology Control blood pressure, cholesterol, glucose, encourage lifestyle/exercise   Hypertension Well controlled with current medications  Monitor blood pressure at home; patient to call if consistently greater than 130/80 Continue DASH diet.   Reminder to go to the ER if any CP, SOB, nausea, dizziness, severe HA, changes vision/speech, left arm numbness and tingling and jaw pain.  Cholesterol Continue statin therapy for LDL  LDL goal of <70 reviewed; newly increased to 10 mg rosuvastatin daily  Continue low cholesterol diet and exercise.  Check lipid panel.   Other abnormal glucse Recent A1Cs at goal Discussed diet/exercise, weight management  Defer A1C; check CMP for serum glucose, monitor weight trends  Obese - BMI 31 Long discussion about weight loss, diet, and exercise Recommended diet heavy in fruits and veggies and low in animal meats, cheeses, and dairy products, appropriate calorie intake Discussed ideal weight for height  and initial weight goal (<150 lb) Patient will work on eating out less and walking once moves to new home Will follow up in 3 months  Vitamin D Def Below goal at last visit; newly taking 24401 IU daily  continue to recommend supplementation to maintain goal of 60-100 Defer Vit D level, check at follow up  History of skin cancer Follows regularly with dermatology   Over 40 minutes of exam, counseling, chart review and critical decision making was performed Future Appointments  Date Time Provider Department Center  05/27/2020  7:10 AM [facility] DEVICE REMOTES [facility]  06/20/2020 10:30 AM [name], MD [facility] None  08/26/2020  7:10 AM [facility] DEVICE REMOTES  [facility]  11/25/2020  7:10 AM [facility] DEVICE REMOTES [facility]  12/26/2020 11:00 AM [name], MD [facility] None  02/24/2021  7:10 AM [facility] DEVICE REMOTES [facility]  05/26/2021  7:10 AM [facility] DEVICE REMOTES [facility]  08/25/2021  7:10 AM [facility] DEVICE REMOTES [facility]     Subjective:  Kristy [name] is a 75 y.o. female who presents for  3 month follow up.    She is following with [facility] Dr. Roderic Scarce due to history of skin cancer, had moh's of left temple remotely. No recent concerns.   BMI is Body mass index is 31.09 kg/m., she has not been working on diet and exercise. Just moved into new home, planning to start walking soon, able to cook and eat at home more. Husband loves sweets and this is a barrier, trying to find alternatives. Planning to cut back on wine, doing 2 glasses.   Wt Readings from Last 3 Encounters:  03/17/20 170 lb (77.1 kg)  02/10/20 172 lb (78 kg)  12/11/19 170 lb (77.1 kg)   Hx of complete heart block in 03/2018 s/p pacemaker and continues to do well; Dr. Johney Frame continues to follow She had a (+) Nuclear stress test early 2020 and underwent cath and had a stent implanted (Mar 2020) and has done well since, completed 1 year of DAPT, now on bASA only.   She has had elevated blood pressure since the 90s. Her blood pressure has been controlled at home, today their BP is BP: 134/68 She does not workout. She denies chest pain, shortness of breath, dizziness.   She is on cholesterol medication (rosuvastatin 20 mg  daily) and denies myalgias. Her cholesterol is at goal. The cholesterol last visit was:   Lab Results  Component Value Date   CHOL 157 12/11/2019   HDL 62 12/11/2019   LDLCALC 71 12/11/2019   TRIG 162 (H) 12/11/2019   CHOLHDL 2.5 12/11/2019    She has not been working on diet and exercise for glucose management. Last A1C in the office was:   Lab Results  Component Value Date   HGBA1C 5.6 12/11/2019   Last GFR: Lab Results  Component Value Date   GFRNONAA 74 12/11/2019   Patient is on Vitamin D supplement.   Lab Results  Component Value Date   VD25OH 70 12/11/2019      Medication Review: Current Outpatient Medications on File Prior to Visit  Medication Sig Dispense Refill  . amLODipine (NORVASC) 5 MG tablet Take 5 mg by mouth daily.    . Ascorbic Acid (VITAMIN C PO) Take 1,000 mg by mouth daily.    Marland Kitchen aspirin EC 81 MG tablet Take 81 mg by mouth daily.    . CHOLECALCIFEROL PO Take 10,000 Units by mouth daily.    . Lutein 20 MG CAPS Take 1 capsule by mouth daily.    . metoprolol succinate (TOPROL-XL) 25 MG 24 hr tablet Take 25 mg by mouth daily.    . rosuvastatin (CRESTOR) 20 MG tablet TAKE 1 TABLET BY MOUTH  DAILY 90 tablet 3  . zinc gluconate 50 MG tablet Take 50 mg by mouth daily.     No current facility-administered medications on file prior to visit.    Allergies  Allergen Reactions  . Penicillins     Current Problems (verified) Patient Active Problem List   Diagnosis Date Noted  . Obesity (BMI 30.0-34.9) 09/01/2019  . History of skin cancer 02/23/2019  . Arteriosclerotic heart disease (ASHD) 11/18/2018  . Cardiac pacemaker 11/18/2018  . Complete heart block (HCC) 11/18/2018  . Vitamin D deficiency 11/18/2018  . Abnormal glucose 11/18/2018  . Essential hypertension 11/18/2018  . Hyperlipidemia 2010    SURGICAL HISTORY She  has a past surgical history that includes Cholecystectomy (2011); Rotator cuff repair (Right, 2016); mohl (2009); PACEMAKER IMPLANT (03/2018); and Coronary angioplasty with stent (05/2018). FAMILY HISTORY Her family history includes Colon cancer in her mother; Diabetes in her father; Hypertension in her brother. SOCIAL HISTORY She  reports that she has never smoked. She has never used smokeless tobacco. She reports that she does not use drugs.   Review of Systems   Constitutional: Negative for malaise/fatigue and weight loss.  HENT: Negative for hearing loss and tinnitus.   Eyes: Negative for blurred vision and double vision.  Respiratory: Negative for cough, sputum production, shortness of breath and wheezing.   Cardiovascular: Negative for chest pain, palpitations, orthopnea, claudication, leg swelling and PND.  Gastrointestinal: Negative for abdominal pain, blood in stool, constipation, diarrhea, heartburn, melena, nausea and vomiting.  Genitourinary: Negative.   Musculoskeletal: Negative for falls, joint pain and myalgias.  Skin: Negative for rash.  Neurological: Negative for dizziness, tingling, sensory change, weakness and headaches.  Endo/Heme/Allergies: Negative for polydipsia.  Psychiatric/Behavioral: Negative.  Negative for depression, memory loss, substance abuse and suicidal ideas. The patient is not nervous/anxious and does not have insomnia.   All other systems reviewed and are negative.    Objective:     Today's Vitals   03/17/20 0929  BP: 134/68  Pulse: 78  Temp: (!) 96.8 F (36 C)  SpO2: 99%  Weight: 170 lb (77.1 kg)  Body mass index is 31.09 kg/m.  General appearance: alert, no distress, WD/WN, female HEENT: normocephalic, sclerae anicteric, TMs pearly, nares patent, no discharge or erythema, pharynx normal Oral cavity: MMM, no lesions Neck: supple, no lymphadenopathy, no thyromegaly, no masses Heart: RRR, normal S1, S2, no murmurs Lungs: CTA bilaterally, no wheezes, rhonchi, or rales Abdomen: +bs, soft, non tender, non distended, no masses, no hepatomegaly, no splenomegaly Musculoskeletal: nontender, no swelling, no obvious deformity Extremities: no edema, no cyanosis, no clubbing Pulses: 2+ symmetric, upper and lower extremities, normal cap refill Neurological: alert, oriented x 3, CN2-12 intact, strength normal upper extremities and lower extremities, sensation normal throughout, DTRs 2+ throughout, no cerebellar  signs, gait normal Psychiatric: normal affect, behavior normal, pleasant    Dan Maker, NP   03/17/2020

## 2020-03-17 ENCOUNTER — Ambulatory Visit (INDEPENDENT_AMBULATORY_CARE_PROVIDER_SITE_OTHER): Payer: Medicare Other | Admitting: Adult Health

## 2020-03-17 ENCOUNTER — Encounter: Payer: Self-pay | Admitting: Adult Health

## 2020-03-17 ENCOUNTER — Other Ambulatory Visit: Payer: Self-pay

## 2020-03-17 VITALS — BP 134/68 | HR 78 | Temp 96.8°F | Wt 170.0 lb

## 2020-03-17 DIAGNOSIS — Z950 Presence of cardiac pacemaker: Secondary | ICD-10-CM

## 2020-03-17 DIAGNOSIS — I10 Essential (primary) hypertension: Secondary | ICD-10-CM | POA: Diagnosis not present

## 2020-03-17 DIAGNOSIS — I442 Atrioventricular block, complete: Secondary | ICD-10-CM | POA: Diagnosis not present

## 2020-03-17 DIAGNOSIS — E559 Vitamin D deficiency, unspecified: Secondary | ICD-10-CM

## 2020-03-17 DIAGNOSIS — E669 Obesity, unspecified: Secondary | ICD-10-CM

## 2020-03-17 DIAGNOSIS — R7309 Other abnormal glucose: Secondary | ICD-10-CM

## 2020-03-17 DIAGNOSIS — E782 Mixed hyperlipidemia: Secondary | ICD-10-CM

## 2020-03-17 DIAGNOSIS — I2510 Atherosclerotic heart disease of native coronary artery without angina pectoris: Secondary | ICD-10-CM | POA: Diagnosis not present

## 2020-03-17 NOTE — Patient Instructions (Signed)
Goals    . Blood Pressure < 130/80    . Exercise 150 min/wk Moderate Activity    . LDL CALC < 70    . Weight (lb) < 150 lb (68 kg)       Suggest increasing high fiber foods (whole grains instead of processed carbohydrate, beans, low starch veggies) to help with weight loss   Exercising to Stay Healthy To become healthy and stay healthy, it is recommended that you do moderate-intensity and vigorous-intensity exercise. You can tell that you are exercising at a moderate intensity if your heart starts beating faster and you start breathing faster but can still hold a conversation. You can tell that you are exercising at a vigorous intensity if you are breathing much harder and faster and cannot hold a conversation while exercising. Exercising regularly is important. It has many health benefits, such as:  Improving overall fitness, flexibility, and endurance.  Increasing bone density.  Helping with weight control.  Decreasing body fat.  Increasing muscle strength.  Reducing stress and tension.  Improving overall health. How often should I exercise? Choose an activity that you enjoy, and set realistic goals. Your health care provider can help you make an activity plan that works for you. Exercise regularly as told by your health care provider. This may include:  Doing strength training two times a week, such as: ? Lifting weights. ? Using resistance bands. ? Push-ups. ? Sit-ups. ? Yoga.  Doing a certain intensity of exercise for a given amount of time. Choose from these options: ? A total of 150 minutes of moderate-intensity exercise every week. ? A total of 75 minutes of vigorous-intensity exercise every week. ? A mix of moderate-intensity and vigorous-intensity exercise every week. Children, pregnant women, people who have not exercised regularly, people who are overweight, and older adults may need to talk with a health care provider about what activities are safe to do. If  you have a medical condition, be sure to talk with your health care provider before you start a new exercise program. What are some exercise ideas? Moderate-intensity exercise ideas include:  Walking 1 mile (1.6 km) in about 15 minutes.  Biking.  Hiking.  Golfing.  Dancing.  Water aerobics. Vigorous-intensity exercise ideas include:  Walking 4.5 miles (7.2 km) or more in about 1 hour.  Jogging or running 5 miles (8 km) in about 1 hour.  Biking 10 miles (16.1 km) or more in about 1 hour.  Lap swimming.  Roller-skating or in-line skating.  Cross-country skiing.  Vigorous competitive sports, such as football, basketball, and soccer.  Jumping rope.  Aerobic dancing. What are some everyday activities that can help me to get exercise?  Yard work, such as: ? Pushing a Surveyor, mining. ? Raking and bagging leaves.  Washing your car.  Pushing a stroller.  Shoveling snow.  Gardening.  Washing windows or floors. How can I be more active in my day-to-day activities?  Use stairs instead of an elevator.  Take a walk during your lunch break.  If you drive, park your car farther away from your work or school.  If you take public transportation, get off one stop early and walk the rest of the way.  Stand up or walk around during all of your indoor phone calls.  Get up, stretch, and walk around every 30 minutes throughout the day.  Enjoy exercise with a friend. Support to continue exercising will help you keep a regular routine of activity. What guidelines can I  follow while exercising?  Before you start a new exercise program, talk with your health care provider.  Do not exercise so much that you hurt yourself, feel dizzy, or get very short of breath.  Wear comfortable clothes and wear shoes with good support.  Drink plenty of water while you exercise to prevent dehydration or heat stroke.  Work out until your breathing and your heartbeat get faster. Where to find  more information  U.[place] Department of Health and Human Services: [employer]at  Centers for Disease Control and Prevention (CDC): http://www.wolf.info/ Summary  Exercising regularly is important. It will improve your overall fitness, flexibility, and endurance.  Regular exercise also will improve your overall health. It can help you control your weight, reduce stress, and improve your bone density.  Do not exercise so much that you hurt yourself, feel dizzy, or get very short of breath.  Before you start a new exercise program, talk with your health care provider. This information is not intended to replace advice given to you by your health care provider. Make sure you discuss any questions you have with your health care provider. Document Revised: 02/08/2017 Document Reviewed: 01/17/2017 Elsevier Patient Education  2020 [employer].

## 2020-03-18 LAB — CBC WITH DIFFERENTIAL/PLATELET
Absolute Monocytes: 742 {cells}/uL (ref 200–950)
Basophils Absolute: 58 {cells}/uL (ref 0–200)
Basophils Relative: 1 %
Eosinophils Absolute: 458 {cells}/uL (ref 15–500)
Eosinophils Relative: 7.9 %
HCT: 39.4 % (ref 35.0–45.0)
Hemoglobin: 13.6 g/dL (ref 11.7–15.5)
Lymphs Abs: 1636 {cells}/uL (ref 850–3900)
MCH: 30.6 pg (ref 27.0–33.0)
MCHC: 34.5 g/dL (ref 32.0–36.0)
MCV: 88.7 fL (ref 80.0–100.0)
MPV: 10.4 fL (ref 7.5–12.5)
Monocytes Relative: 12.8 %
Neutro Abs: 2906 {cells}/uL (ref 1500–7800)
Neutrophils Relative %: 50.1 %
Platelets: 234 Thousand/uL (ref 140–400)
RBC: 4.44 Million/uL (ref 3.80–5.10)
RDW: 12.4 % (ref 11.0–15.0)
Total Lymphocyte: 28.2 %
WBC: 5.8 Thousand/uL (ref 3.8–10.8)

## 2020-03-18 LAB — COMPLETE METABOLIC PANEL WITH GFR
AG Ratio: 2.3 (calc) (ref 1.0–2.5)
ALT: 13 U/L (ref 6–29)
AST: 18 U/L (ref 10–35)
Albumin: 4.4 g/dL (ref 3.6–5.1)
Alkaline phosphatase (APISO): 77 U/L (ref 37–153)
BUN: 11 mg/dL (ref 7–25)
CO2: 29 mmol/L (ref 20–32)
Calcium: 9.6 mg/dL (ref 8.6–10.4)
Chloride: 106 mmol/L (ref 98–110)
Creat: 0.82 mg/dL (ref 0.60–0.93)
GFR, Est African American: 81 mL/min/{1.73_m2} (ref >=60)
GFR, Est Non African American: 70 mL/min/{1.73_m2} (ref >=60)
Globulin: 1.9 g/dL (calc) (ref 1.9–3.7)
Glucose, Bld: 102 mg/dL — ABNORMAL HIGH (ref 65–99)
Potassium: 4.7 mmol/L (ref 3.5–5.3)
Sodium: 140 mmol/L (ref 135–146)
Total Bilirubin: 0.7 mg/dL (ref 0.2–1.2)
Total Protein: 6.3 g/dL (ref 6.1–8.1)

## 2020-03-18 LAB — LIPID PANEL
Cholesterol: 147 mg/dL
HDL: 66 mg/dL
LDL Cholesterol (Calc): 63 mg/dL
Non-HDL Cholesterol (Calc): 81 mg/dL
Total CHOL/HDL Ratio: 2.2 (calc)
Triglycerides: 93 mg/dL

## 2020-03-18 LAB — MAGNESIUM: Magnesium: 2.2 mg/dL (ref 1.5–2.5)

## 2020-03-18 LAB — TSH: TSH: 3.49 m[IU]/L (ref 0.40–4.50)

## 2020-05-27 ENCOUNTER — Ambulatory Visit (INDEPENDENT_AMBULATORY_CARE_PROVIDER_SITE_OTHER): Payer: Medicare Other

## 2020-05-27 DIAGNOSIS — I442 Atrioventricular block, complete: Secondary | ICD-10-CM

## 2020-05-27 LAB — CUP PACEART REMOTE DEVICE CHECK
Battery Remaining Longevity: 127 mo
Battery Voltage: 3.01 V
Brady Statistic AP VP Percent: 5.52 %
Brady Statistic AP VS Percent: 0 %
Brady Statistic AS VP Percent: 94.46 %
Brady Statistic AS VS Percent: 0.02 %
Brady Statistic RA Percent Paced: 5.51 %
Brady Statistic RV Percent Paced: 99.98 %
Date Time Interrogation Session: 20220318025246
Implantable Lead Implant Date: 20200102
Implantable Lead Implant Date: 20200102
Implantable Lead Location: 753859
Implantable Lead Location: 753860
Implantable Lead Model: 4076
Implantable Lead Model: 4076
Implantable Pulse Generator Implant Date: 20200102
Lead Channel Impedance Value: 361 Ohm
Lead Channel Impedance Value: 380 Ohm
Lead Channel Impedance Value: 418 Ohm
Lead Channel Impedance Value: 494 Ohm
Lead Channel Pacing Threshold Amplitude: 0.5 V
Lead Channel Pacing Threshold Amplitude: 0.625 V
Lead Channel Pacing Threshold Pulse Width: 0.4 ms
Lead Channel Pacing Threshold Pulse Width: 0.4 ms
Lead Channel Sensing Intrinsic Amplitude: 12 mV
Lead Channel Sensing Intrinsic Amplitude: 12 mV
Lead Channel Sensing Intrinsic Amplitude: 3.5 mV
Lead Channel Sensing Intrinsic Amplitude: 3.5 mV
Lead Channel Setting Pacing Amplitude: 1.5 V
Lead Channel Setting Pacing Amplitude: 2 V
Lead Channel Setting Pacing Pulse Width: 0.4 ms
Lead Channel Setting Sensing Sensitivity: 2 mV

## 2020-06-03 NOTE — Progress Notes (Signed)
Remote pacemaker transmission.   

## 2020-06-20 ENCOUNTER — Ambulatory Visit: Payer: Medicare Other | Admitting: Internal Medicine

## 2020-08-08 NOTE — Progress Notes (Signed)
Cardiology Office Note Date:  08/10/2020  Patient ID:  Kristy [name], Kristy [name] 07-15-1945, MRN [id number] PCP:  Unk Housel, MD  Electrophysiologist  Dr. Rayann Heman    Chief Complaint:  annual device visit  History of Present Illness: Kristy [name] is a 75 y.o. female with history of CAD (PCI March 2020), HTN, HLD, CHB w/PPM.  I saw her Jan 2021 She feels well.  Not yet gotten into their place yet unfortunately construction/renovations not yet completed and still with her step son and hi family.  She does not formally exercise but is up/down the stairs numerous times a day, does the shopping, helps around the house, all without symptoms or difficulty.  No CP, palpitations or SOB,no DOE.  No dizzy spells, near syncope or syncope. She is hoping at some point to be able to get off some of the medicines. Crestor was increased and discussed diet.  DAPT continued  She saw Dr. Rayann Heman, doing well, plavix was stopped, planned for annual visit.  03/17/20 HDL 66 LDL 63 Trigs 93 LFTs wnl  TODAY She opens the visit by mentioning that she is very nervous and anxious about today's visit, always tends to be when seeing doctors. She though is doing/feeling well. She does not exercise, but has access to parks and trails very close by her that she hopes to get started walking on. Her husband is 21 and unable to join her which makes it harder to get out and exercise. She cares for the home, no difficulties with her ADLs No CP, SON, no dizzy spells, near syncope or syncope.    Device information MDT dual chamber PPM implanted 03/13/2018 (in [place])   Past Medical History:  Diagnosis Date  . Abnormal glucose 11/18/2018  . Allergy    penicillin  . Arteriosclerotic heart disease (ASHD) 11/18/2018   s/p PCI 05/2018  . Cardiac pacemaker 11/18/2018  . Complete heart block ([place]) 11/18/2018  . Essential hypertension 11/18/2018  . Hyperlipidemia 2010  . Hypertension 2000  . Osteoporosis     Past Surgical  History:  Procedure Laterality Date  . CHOLECYSTECTOMY  2011  . CORONARY ANGIOPLASTY WITH STENT PLACEMENT  05/2018  . mohl  2009   face  . PACEMAKER IMPLANT  03/2018   MDT pacemaker implanted in [place] Right 2016    Current Outpatient Medications  Medication Sig Dispense Refill  . amLODipine (NORVASC) 5 MG tablet Take 5 mg by mouth daily.    . Ascorbic Acid (VITAMIN C PO) Take 1,000 mg by mouth daily.    Marland Kitchen aspirin EC 81 MG tablet Take 81 mg by mouth daily.    . CHOLECALCIFEROL PO Take 10,000 Units by mouth daily.    . Lutein 20 MG CAPS Take 1 capsule by mouth daily.    . metoprolol succinate (TOPROL-XL) 25 MG 24 hr tablet Take 25 mg by mouth daily.    . rosuvastatin (CRESTOR) 20 MG tablet TAKE 1 TABLET BY MOUTH  DAILY 90 tablet 3  . zinc gluconate 50 MG tablet Take 50 mg by mouth daily.     No current facility-administered medications for this visit.    Allergies:   Penicillins   Social History:  The patient  reports that she has never smoked. She has never used smokeless tobacco. She reports that she does not use drugs.   Family History:  The patient's family history includes Colon cancer in her mother; Diabetes in her father; Hypertension in her brother.  ROS:  Please see the history of present illness.  All other systems are reviewed and otherwise negative.   PHYSICAL EXAM:  VS:  BP (!) 144/76   Pulse 93   Ht 5\' 2"  (1.575 m)   Wt 167 lb 9.6 oz (76 kg)   SpO2 97%   BMI 30.65 kg/m  BMI: Body mass index is 30.65 kg/m. Well nourished, well developed, in no acute distress  HEENT: normocephalic, atraumatic  Neck: no JVD, carotid bruits or masses Cardiac: RRR; no significant murmurs, no rubs, or gallops Lungs:  CTA b/l, no wheezing, rhonchi or rales  Abd: soft, nontender, obese MS: no deformity or atrophy Ext: no edema  Skin: warm and dry, no rash Neuro:  No gross deficits appreciated Psych: euthymic mood, full affect  PPM site is stable, no  tethering or discomfort   EKG:  Not done today  PPM interrogation done today and reviewed by myself:  Battery and lead measurements are good No arrhythmias VP 100% No R waves at 40bpm today    05/19/2018: LHC/PCI (prompted by abnormal stress test) SEE FULL REPORT FOR FULL CORONARY DETAILS LM is large, no significant disease LAD small caliber, mid 99%  >>>PCI with Xience DES LCx large caliber, large OM RCA moderate caliber, dominant, PDA is small caliber          Mid RCA 2 tandem lesions 50-60% Ramus, large bifrucating, no significant obstructive disease   Recent Labs: 03/17/2020: ALT 13; BUN 11; Creat 0.82; Hemoglobin 13.6; Magnesium 2.2; Platelets 234; Potassium 4.7; Sodium 140; TSH 3.49  03/17/2020: Cholesterol 147; HDL 66; LDL Cholesterol (Calc) 63; Total CHOL/HDL Ratio 2.2; Triglycerides 93   CrCl cannot be calculated (Patient's most recent lab result is older than the maximum 21 days allowed.).   Wt Readings from Last 3 Encounters:  08/10/20 167 lb 9.6 oz (76 kg)  03/17/20 170 lb (77.1 kg)  02/10/20 172 lb (78 kg)     Other studies reviewed: Additional studies/records reviewed today include: summarized above  ASSESSMENT AND PLAN:  1. PPM     Intact function, no programming changes made     Dependent at 40bpm today  2. CAD     PCI to LAD March 2020     On ASA, BB, statin     No symptoms       3. HTN     Unusually high for her here, she is quite anxious about her visit  4. HLD     Could be better     She would like to adjust diet and get started with some exercise, lose some weight prior to medication adjustments   Disposition: remotes as usual, back in clinic in a year, sooner if needed    Current medicines are reviewed at length with the patient today.  The patient did not have any concerns regarding medicines.  Venetia Night, PA-C 08/10/2020 4:23 PM     [address] [place] [id number] [phone]  (office)  [phone] (fax)

## 2020-08-10 ENCOUNTER — Other Ambulatory Visit: Payer: Self-pay

## 2020-08-10 ENCOUNTER — Ambulatory Visit (INDEPENDENT_AMBULATORY_CARE_PROVIDER_SITE_OTHER): Payer: Medicare Other | Admitting: Physician Assistant

## 2020-08-10 ENCOUNTER — Encounter: Payer: Self-pay | Admitting: Physician Assistant

## 2020-08-10 VITALS — BP 144/76 | HR 93 | Ht 62.0 in | Wt 167.6 lb

## 2020-08-10 DIAGNOSIS — I442 Atrioventricular block, complete: Secondary | ICD-10-CM

## 2020-08-10 DIAGNOSIS — Z950 Presence of cardiac pacemaker: Secondary | ICD-10-CM

## 2020-08-10 DIAGNOSIS — E785 Hyperlipidemia, unspecified: Secondary | ICD-10-CM

## 2020-08-10 DIAGNOSIS — I2510 Atherosclerotic heart disease of native coronary artery without angina pectoris: Secondary | ICD-10-CM

## 2020-08-10 DIAGNOSIS — I10 Essential (primary) hypertension: Secondary | ICD-10-CM | POA: Diagnosis not present

## 2020-08-10 LAB — CUP PACEART INCLINIC DEVICE CHECK
Battery Remaining Longevity: 128 mo
Battery Voltage: 3.01 V
Brady Statistic AP VP Percent: 2.61 %
Brady Statistic AP VS Percent: 0 %
Brady Statistic AS VP Percent: 97.36 %
Brady Statistic AS VS Percent: 0.03 %
Brady Statistic RA Percent Paced: 2.61 %
Brady Statistic RV Percent Paced: 99.97 %
Date Time Interrogation Session: 20220601165748
Implantable Lead Implant Date: 20200102
Implantable Lead Implant Date: 20200102
Implantable Lead Location: 753859
Implantable Lead Location: 753860
Implantable Lead Model: 4076
Implantable Lead Model: 4076
Implantable Pulse Generator Implant Date: 20200102
Lead Channel Impedance Value: 399 Ohm
Lead Channel Impedance Value: 418 Ohm
Lead Channel Impedance Value: 494 Ohm
Lead Channel Impedance Value: 570 Ohm
Lead Channel Pacing Threshold Amplitude: 0.5 V
Lead Channel Pacing Threshold Amplitude: 0.625 V
Lead Channel Pacing Threshold Pulse Width: 0.4 ms
Lead Channel Pacing Threshold Pulse Width: 0.4 ms
Lead Channel Sensing Intrinsic Amplitude: 12 mV
Lead Channel Sensing Intrinsic Amplitude: 12 mV
Lead Channel Sensing Intrinsic Amplitude: 2.625 mV
Lead Channel Sensing Intrinsic Amplitude: 3.625 mV
Lead Channel Setting Pacing Amplitude: 1.5 V
Lead Channel Setting Pacing Amplitude: 2 V
Lead Channel Setting Pacing Pulse Width: 0.4 ms
Lead Channel Setting Sensing Sensitivity: 2 mV

## 2020-08-10 NOTE — Patient Instructions (Signed)

## 2020-08-26 ENCOUNTER — Ambulatory Visit (INDEPENDENT_AMBULATORY_CARE_PROVIDER_SITE_OTHER): Payer: Medicare Other

## 2020-08-26 DIAGNOSIS — I442 Atrioventricular block, complete: Secondary | ICD-10-CM | POA: Diagnosis not present

## 2020-08-26 LAB — CUP PACEART REMOTE DEVICE CHECK
Battery Remaining Longevity: 125 mo
Battery Voltage: 3.01 V
Brady Statistic AP VP Percent: 3.64 %
Brady Statistic AP VS Percent: 0 %
Brady Statistic AS VP Percent: 96.34 %
Brady Statistic AS VS Percent: 0.02 %
Brady Statistic RA Percent Paced: 3.63 %
Brady Statistic RV Percent Paced: 99.98 %
Date Time Interrogation Session: 20220616220256
Implantable Lead Implant Date: 20200102
Implantable Lead Implant Date: 20200102
Implantable Lead Location: 753859
Implantable Lead Location: 753860
Implantable Lead Model: 4076
Implantable Lead Model: 4076
Implantable Pulse Generator Implant Date: 20200102
Lead Channel Impedance Value: 342 Ohm
Lead Channel Impedance Value: 361 Ohm
Lead Channel Impedance Value: 437 Ohm
Lead Channel Impedance Value: 513 Ohm
Lead Channel Pacing Threshold Amplitude: 0.5 V
Lead Channel Pacing Threshold Amplitude: 0.625 V
Lead Channel Pacing Threshold Pulse Width: 0.4 ms
Lead Channel Pacing Threshold Pulse Width: 0.4 ms
Lead Channel Sensing Intrinsic Amplitude: 12 mV
Lead Channel Sensing Intrinsic Amplitude: 12 mV
Lead Channel Sensing Intrinsic Amplitude: 2.875 mV
Lead Channel Sensing Intrinsic Amplitude: 2.875 mV
Lead Channel Setting Pacing Amplitude: 1.5 V
Lead Channel Setting Pacing Amplitude: 2 V
Lead Channel Setting Pacing Pulse Width: 0.4 ms
Lead Channel Setting Sensing Sensitivity: 2 mV

## 2020-09-06 ENCOUNTER — Encounter: Payer: Self-pay | Admitting: Adult Health

## 2020-09-06 ENCOUNTER — Other Ambulatory Visit: Payer: Self-pay

## 2020-09-06 ENCOUNTER — Ambulatory Visit (INDEPENDENT_AMBULATORY_CARE_PROVIDER_SITE_OTHER): Payer: Medicare Other | Admitting: Adult Health

## 2020-09-06 VITALS — BP 134/78 | HR 74 | Temp 97.4°F | Wt 169.6 lb

## 2020-09-06 DIAGNOSIS — Z950 Presence of cardiac pacemaker: Secondary | ICD-10-CM | POA: Diagnosis not present

## 2020-09-06 DIAGNOSIS — I442 Atrioventricular block, complete: Secondary | ICD-10-CM

## 2020-09-06 DIAGNOSIS — Z0001 Encounter for general adult medical examination with abnormal findings: Secondary | ICD-10-CM | POA: Diagnosis not present

## 2020-09-06 DIAGNOSIS — E559 Vitamin D deficiency, unspecified: Secondary | ICD-10-CM

## 2020-09-06 DIAGNOSIS — I10 Essential (primary) hypertension: Secondary | ICD-10-CM

## 2020-09-06 DIAGNOSIS — E782 Mixed hyperlipidemia: Secondary | ICD-10-CM

## 2020-09-06 DIAGNOSIS — I2510 Atherosclerotic heart disease of native coronary artery without angina pectoris: Secondary | ICD-10-CM

## 2020-09-06 DIAGNOSIS — R6889 Other general symptoms and signs: Secondary | ICD-10-CM

## 2020-09-06 DIAGNOSIS — R7309 Other abnormal glucose: Secondary | ICD-10-CM

## 2020-09-06 DIAGNOSIS — E2839 Other primary ovarian failure: Secondary | ICD-10-CM

## 2020-09-06 DIAGNOSIS — E669 Obesity, unspecified: Secondary | ICD-10-CM

## 2020-09-06 DIAGNOSIS — Z0000 Encounter for general adult medical examination without abnormal findings: Secondary | ICD-10-CM

## 2020-09-06 DIAGNOSIS — Z85828 Personal history of other malignant neoplasm of skin: Secondary | ICD-10-CM

## 2020-09-06 NOTE — Progress Notes (Signed)
MEDICARE ANNUAL WELLNESS VISIT AND FOLLOW UP  Assessment:   Diagnoses and all orders for this visit:  Encounter for Medicare annual wellness exam Due annually   Complete heart block/ S/p pacemaker Now follows with Dr. Rayann Heman Doing well s/p pacemaker Monitor   Arteriosclerotic heart disease S/p stent 05/2018; completed DAPT x 1 year, now on bASA Denies CP Follows with cardiology Control blood pressure, cholesterol, glucose, encourage lifestyle/exercise   Hypertension Well controlled with current medications  Monitor blood pressure at home; patient to call if consistently greater than 130/80 Continue DASH diet.   Reminder to go to the ER if any CP, SOB, nausea, dizziness, severe HA, changes vision/speech, left arm numbness and tingling and jaw pain.  Cholesterol Continue statin therapy for LDL  LDL goal of <70 reviewed; newly increased to 10 mg rosuvastatin daily  Continue low cholesterol diet and exercise.  Check lipid panel.   Other abnormal glucse Recent A1Cs at goal Discussed diet/exercise, weight management  Defer A1C; check CMP for serum glucose, monitor weight trends  Obese - BMI 31 Long discussion about weight loss, diet, and exercise Recommended diet heavy in fruits and veggies and low in animal meats, cheeses, and dairy products, appropriate calorie intake Discussed ideal weight for height  and initial weight goal (<150 lb) Patient will work on eating out less and walking once moves to new home Will follow up in 3 months  Vitamin D Def continue to recommend supplementation to maintain goal of 60-100 Defer Vit D level, check at CPE  History of skin cancer Follows regularly with dermatology   Estrogen Def - DEXA ordwered to schedule with next mammogram   Over 40 minutes of exam, counseling, chart review and critical decision making was performed Future Appointments  Date Time Provider [place]  11/25/2020  7:10 AM [facility] DEVICE REMOTES  [facility]  12/26/2020 11:00 AM Unk Vieau, MD [facility] None  02/24/2021  7:10 AM [facility] DEVICE REMOTES [facility]  05/26/2021  7:10 AM [facility] DEVICE REMOTES [facility]  08/25/2021  7:10 AM [facility] DEVICE REMOTES [facility]     Plan:   During the course of the visit the patient was educated and counseled about appropriate screening and preventive services including:   Pneumococcal vaccine  Prevnar 13 Influenza vaccine Td vaccine Screening electrocardiogram Bone densitometry screening Colorectal cancer screening Diabetes screening Glaucoma screening Nutrition counseling  Advanced directives: requested   Subjective:  Kristy [name] is a 75 y.o. female who presents for Medicare Annual Wellness Visit and 3 month follow up.    She is following with [facility] Dr. Martin Majestic due to history of skin cancer, had moh's of left temple remotely. No recent concerns.   BMI is Body mass index is 31.02 kg/m., she has been working on diet, more smoothies. Minimal exercise. In new home, walks 5 min to mailbox. Is motivated to start.  Wt Readings from Last 3 Encounters:  09/06/20 169 lb 9.6 oz (76.9 kg)  08/10/20 167 lb 9.6 oz (76 kg)  03/17/20 170 lb (77.1 kg)   Hx of complete heart block in 03/2018 s/p pacemaker and continues to do well; She is now established with Dr. Rayann Heman.  She had a (+) Nuclear stress test early 2020 and underwent cath and had a stent implanted (Mar 2020) and has done well since, completed 1 year of DAPT, now on bASA only.   She has had elevated blood pressure since the 90s. Her blood pressure has been controlled at home,  today their BP is BP: 134/78 She does not workout. She denies chest pain, shortness of breath, dizziness.   She is on cholesterol medication (rosuvastatin 10 mg daily) and denies myalgias. Her cholesterol is at goal. The cholesterol last visit was:   Lab Results   Component Value Date   CHOL 147 03/17/2020   HDL 66 03/17/2020   LDLCALC 63 03/17/2020   TRIG 93 03/17/2020   CHOLHDL 2.2 03/17/2020    She has not been working on diet and exercise for glucose management. Last A1C in the office was:  Lab Results  Component Value Date   HGBA1C 5.6 12/11/2019   Last GFR: Lab Results  Component Value Date   GFRNONAA 70 03/17/2020   Patient is on Vitamin D supplement.   Lab Results  Component Value Date   VD25OH 70 12/11/2019        Medication Review: Current Outpatient Medications on File Prior to Visit  Medication Sig Dispense Refill   amLODipine (NORVASC) 5 MG tablet Take 5 mg by mouth daily.     Ascorbic Acid (VITAMIN C PO) Take 1,000 mg by mouth daily.     aspirin EC 81 MG tablet Take 81 mg by mouth daily.     CHOLECALCIFEROL PO Take 10,000 Units by mouth daily.     Lutein 20 MG CAPS Take 1 capsule by mouth daily.     metoprolol succinate (TOPROL-XL) 25 MG 24 hr tablet Take 25 mg by mouth daily.     rosuvastatin (CRESTOR) 20 MG tablet TAKE 1 TABLET BY MOUTH  DAILY 90 tablet 3   zinc gluconate 50 MG tablet Take 50 mg by mouth daily.     No current facility-administered medications on file prior to visit.    Allergies  Allergen Reactions   Penicillins     Current Problems (verified) Patient Active Problem List   Diagnosis Date Noted   Obesity (BMI 30.0-34.9) 09/01/2019   History of skin cancer 02/23/2019   Arteriosclerotic heart disease (ASHD) 11/18/2018   Cardiac pacemaker 11/18/2018   Complete heart block ([place]) 11/18/2018   Vitamin D deficiency 11/18/2018   Abnormal glucose 11/18/2018   Essential hypertension 11/18/2018   Hyperlipidemia 2010    Screening Tests Immunization History  Administered Date(s) Administered   Fluad Quad(high Dose 65+) 12/30/2018   Influenza, High Dose Seasonal PF 01/12/2020   Influenza,inj,quad, With Preservative 01/14/2017   PFIZER(Purple Top)SARS-COV-2 Vaccination 04/17/2019, 05/12/2019,  12/31/2019   Pneumococcal Conjugate-13 04/12/2018   Pneumococcal Polysaccharide-23 09/02/2019   Zoster Recombinat (Shingrix) 02/09/2018, 04/29/2018    Preventative care: Last colonoscopy: last in [place] normal per patient (patient will request report), doing q5y due to maternal colon cancer hx, reports next due 02/2022 Last mammogram: 02/2020 Last pap smear/pelvic exam: Done, last in [place], no personal or family hx  DEXA: has had per patient, normal remote, ordered to schedule with next mmg  Prior vaccinations: TD or Tdap: Unsure, will get PRN Influenza: 01/2020  Pneumococcal: 08/2019 Prevnar13: 04/2018 Shingrix: 2/2, 2020 Covid 19: 2/2, 2021, pfizer + 1 booster  Names of Other Physician/Practitioners you currently use: 1. [facility] here for primary care 2. Dr. Katy Fitch, eye doctor, last visit 2022, monitoring cataract, possible surgery this year 3. Dr. Deanna Artis, dentist, last visit 2022  Patient Care Team: Unk Tatlock, MD as PCP - General (Internal Medicine) Ulla Gallo, MD as Consulting Physician (Dermatology)  SURGICAL HISTORY She  has a past surgical history that includes Cholecystectomy (2011); Rotator cuff repair (  Right, 2016); mohl (2009); PACEMAKER IMPLANT (03/2018); and Coronary angioplasty with stent (05/2018). FAMILY HISTORY Her family history includes Colon cancer in her mother; Diabetes in her father; Hypertension in her brother. SOCIAL HISTORY She  reports that she has never smoked. She has never used smokeless tobacco. She reports that she does not use drugs.   MEDICARE WELLNESS OBJECTIVES: Physical activity: Current Exercise Habits: The patient does not participate in regular exercise at present, Exercise limited by: None identified Cardiac risk factors: Cardiac Risk Factors include: advanced age (>73men, >53 women);dyslipidemia;hypertension;obesity (BMI >30kg/m2);sedentary lifestyle Depression/mood screen:   Depression  screen [facility] 2/9 09/06/2020  Decreased Interest 0  Down, Depressed, Hopeless 0  PHQ - 2 Score 0    ADLs:  In your present state of health, do you have any difficulty performing the following activities: 09/06/2020 12/11/2019  Hearing? N N  Vision? N N  Difficulty concentrating or making decisions? N N  Walking or climbing stairs? N N  Dressing or bathing? N N  Doing errands, shopping? N N  Some recent data might be hidden     Cognitive Testing  Alert? Yes  Normal Appearance?Yes  Oriented to person? Yes  Place? Yes   Time? Yes  Recall of three objects?  Yes  Can perform simple calculations? Yes  Displays appropriate judgment?Yes  Can read the correct time from a watch face?Yes  EOL planning: Does Patient Have a Medical Advance Directive?: Yes Type of Advance Directive: Healthcare Power of Attorney, Living will Does patient want to make changes to medical advance directive?: No - Patient declined Copy of [place] in Chart?: No - copy requested  Review of Systems  Constitutional:  Negative for malaise/fatigue and weight loss.  HENT:  Negative for hearing loss and tinnitus.   Eyes:  Negative for blurred vision and double vision.  Respiratory:  Negative for cough, sputum production, shortness of breath and wheezing.   Cardiovascular:  Negative for chest pain, palpitations, orthopnea, claudication, leg swelling and PND.  Gastrointestinal:  Negative for abdominal pain, blood in stool, constipation, diarrhea, heartburn, melena, nausea and vomiting.  Genitourinary: Negative.   Musculoskeletal:  Negative for falls, joint pain and myalgias.  Skin:  Negative for rash.  Neurological:  Negative for dizziness, tingling, sensory change, weakness and headaches.  Endo/Heme/Allergies:  Negative for polydipsia.  Psychiatric/Behavioral: Negative.  Negative for depression, memory loss, substance abuse and suicidal ideas. The patient is not nervous/anxious and does not have  insomnia.   All other systems reviewed and are negative.   Objective:     Today's Vitals   09/06/20 1032 09/06/20 1104  BP: 140/74 134/78  Pulse: 74   Temp: (!) 97.4 F (36.3 C)   SpO2: 97%   Weight: 169 lb 9.6 oz (76.9 kg)    Body mass index is 31.02 kg/m.  General appearance: alert, no distress, WD/WN, female HEENT: normocephalic, sclerae anicteric, TMs pearly, nares patent, no discharge or erythema, pharynx normal Oral cavity: MMM, no lesions Neck: supple, no lymphadenopathy, no thyromegaly, no masses Heart: RRR, normal S1, S2, no murmurs Lungs: CTA bilaterally, no wheezes, rhonchi, or rales Abdomen: +bs, soft, non tender, non distended, no masses, no hepatomegaly, no splenomegaly Musculoskeletal: nontender, no swelling, no obvious deformity Extremities: no edema, no cyanosis, no clubbing Pulses: 2+ symmetric, upper and lower extremities, normal cap refill Neurological: alert, oriented x 3, CN2-12 intact, strength normal upper extremities and lower extremities, sensation normal throughout, DTRs 2+ throughout, no cerebellar signs, gait normal Psychiatric: normal affect,  behavior normal, pleasant   Medicare Attestation I have personally reviewed: The patient's medical and social history Their use of alcohol, tobacco or illicit drugs Their current medications and supplements The patient's functional ability including ADLs,fall risks, home safety risks, cognitive, and hearing and visual impairment Diet and physical activities Evidence for depression or mood disorders  The patient's weight, height, BMI, and visual acuity have been recorded in the chart.  I have made referrals, counseling, and provided education to the patient based on review of the above and I have provided the patient with a written personalized care plan for preventive services.     Izora Ribas, NP   09/06/2020

## 2020-09-07 LAB — CBC WITH DIFFERENTIAL/PLATELET
Absolute Monocytes: 743 cells/uL (ref 200–950)
Basophils Absolute: 61 cells/uL (ref 0–200)
Basophils Relative: 1.1 %
Eosinophils Absolute: 380 cells/uL (ref 15–500)
Eosinophils Relative: 6.9 %
HCT: 41.3 % (ref 35.0–45.0)
Hemoglobin: 13.8 g/dL (ref 11.7–15.5)
Lymphs Abs: 1568 cells/uL (ref 850–3900)
MCH: 30.1 pg (ref 27.0–33.0)
MCHC: 33.4 g/dL (ref 32.0–36.0)
MCV: 90.2 fL (ref 80.0–100.0)
MPV: 10.3 fL (ref 7.5–12.5)
Monocytes Relative: 13.5 %
Neutro Abs: 2750 cells/uL (ref 1500–7800)
Neutrophils Relative %: 50 %
Platelets: 240 10*3/uL (ref 140–400)
RBC: 4.58 10*6/uL (ref 3.80–5.10)
RDW: 12.3 % (ref 11.0–15.0)
Total Lymphocyte: 28.5 %
WBC: 5.5 10*3/uL (ref 3.8–10.8)

## 2020-09-07 LAB — TSH: TSH: 2.69 mIU/L (ref 0.40–4.50)

## 2020-09-07 LAB — COMPLETE METABOLIC PANEL WITH GFR
AG Ratio: 2 (calc) (ref 1.0–2.5)
ALT: 15 U/L (ref 6–29)
AST: 19 U/L (ref 10–35)
Albumin: 4.3 g/dL (ref 3.6–5.1)
Alkaline phosphatase (APISO): 66 U/L (ref 37–153)
BUN: 10 mg/dL (ref 7–25)
CO2: 27 mmol/L (ref 20–32)
Calcium: 9.3 mg/dL (ref 8.6–10.4)
Chloride: 104 mmol/L (ref 98–110)
Creat: 0.85 mg/dL (ref 0.60–0.93)
GFR, Est African American: 77 mL/min/{1.73_m2} (ref >=60)
GFR, Est Non African American: 66 mL/min/{1.73_m2} (ref >=60)
Globulin: 2.1 g/dL (calc) (ref 1.9–3.7)
Glucose, Bld: 101 mg/dL — ABNORMAL HIGH (ref 65–99)
Potassium: 5 mmol/L (ref 3.5–5.3)
Sodium: 140 mmol/L (ref 135–146)
Total Bilirubin: 0.8 mg/dL (ref 0.2–1.2)
Total Protein: 6.4 g/dL (ref 6.1–8.1)

## 2020-09-07 LAB — LIPID PANEL
Cholesterol: 169 mg/dL (ref <200)
HDL: 66 mg/dL (ref >=50)
LDL Cholesterol (Calc): 78 mg/dL (calc)
Non-HDL Cholesterol (Calc): 103 mg/dL (calc) (ref <130)
Total CHOL/HDL Ratio: 2.6 (calc) (ref <5.0)
Triglycerides: 145 mg/dL (ref <150)

## 2020-09-07 LAB — MAGNESIUM: Magnesium: 2.2 mg/dL (ref 1.5–2.5)

## 2020-09-14 NOTE — Progress Notes (Signed)
Remote pacemaker transmission.   

## 2020-09-27 ENCOUNTER — Other Ambulatory Visit: Payer: Self-pay

## 2020-09-27 ENCOUNTER — Other Ambulatory Visit: Payer: Self-pay | Admitting: Internal Medicine

## 2020-09-27 ENCOUNTER — Other Ambulatory Visit: Payer: Medicare Other

## 2020-09-27 DIAGNOSIS — R300 Dysuria: Secondary | ICD-10-CM

## 2020-09-27 DIAGNOSIS — R310 Gross hematuria: Secondary | ICD-10-CM

## 2020-09-27 MED ORDER — NITROFURANTOIN MONOHYD MACRO 100 MG PO CAPS: ORAL_CAPSULE | ORAL | 0 refills | Status: DC

## 2020-09-27 NOTE — Progress Notes (Deleted)
Assessment and Plan:  There are no diagnoses linked to this encounter.    Further disposition pending results of labs. Discussed med's effects and SE's.   Over 30 minutes of exam, counseling, chart review, and critical decision making was performed.   Future Appointments  Date Time Provider [place]  09/28/2020 11:00 AM Magda Bernheim, NP [facility] None  09/30/2020  1:00 PM [facility] DEXA [facility]  11/25/2020  7:10 AM [facility] DEVICE REMOTES [facility]  12/26/2020 11:00 AM Unk Mazor, MD [facility] None  02/24/2021  7:10 AM [facility] DEVICE REMOTES [facility]  05/26/2021  7:10 AM [facility] DEVICE REMOTES [facility]  08/25/2021  7:10 AM [facility] DEVICE REMOTES [facility]    ------------------------------------------------------------------------------------------------------------------   HPI There were no vitals taken for this visit. 75 y.o.female presents for possible UTI  Past Medical History:  Diagnosis Date   Abnormal glucose 11/18/2018   Allergy    penicillin   Arteriosclerotic heart disease (ASHD) 11/18/2018   s/p PCI 05/2018   Cardiac pacemaker 11/18/2018   Complete heart block ([place]) 11/18/2018   Essential hypertension 11/18/2018   Hyperlipidemia 2010   Hypertension 2000   Osteoporosis      Allergies  Allergen Reactions   Penicillins     Current Outpatient Medications on File Prior to Visit  Medication Sig   amLODipine (NORVASC) 5 MG tablet Take 5 mg by mouth daily.   Ascorbic Acid (VITAMIN C PO) Take 1,000 mg by mouth daily.   aspirin EC 81 MG tablet Take 81 mg by mouth daily.   CHOLECALCIFEROL PO Take 10,000 Units by mouth daily.   Lutein 20 MG CAPS Take 1 capsule by mouth daily.   metoprolol succinate (TOPROL-XL) 25 MG 24 hr tablet Take 25 mg by mouth daily.   rosuvastatin (CRESTOR) 20 MG tablet TAKE 1 TABLET BY MOUTH  DAILY   zinc gluconate 50 MG tablet Take 50 mg by mouth  daily.   No current facility-administered medications on file prior to visit.    ROS: all negative except above.   Physical Exam:  There were no vitals taken for this visit.  General Appearance: Well nourished, in no apparent distress. Eyes: PERRLA, EOMs, conjunctiva no swelling or erythema Sinuses: No Frontal/maxillary tenderness ENT/Mouth: Ext aud canals clear, TMs without erythema, bulging. No erythema, swelling, or exudate on post pharynx.  Tonsils not swollen or erythematous. Hearing normal.  Neck: Supple, thyroid normal.  Respiratory: Respiratory effort normal, BS equal bilaterally without rales, rhonchi, wheezing or stridor.  Cardio: RRR with no MRGs. Brisk peripheral pulses without edema.  Abdomen: Soft, + BS.  Non tender, no guarding, rebound, hernias, masses. Lymphatics: Non tender without lymphadenopathy.  Musculoskeletal: Full ROM, 5/5 strength, normal gait.  Skin: Warm, dry without rashes, lesions, ecchymosis.  Neuro: Cranial nerves intact. Normal muscle tone, no cerebellar symptoms. Sensation intact.  Psych: Awake and oriented X 3, normal affect, Insight and Judgment appropriate.     Magda Bernheim, NP 1:15 PM [facility]

## 2020-09-28 ENCOUNTER — Ambulatory Visit: Payer: Medicare Other | Admitting: Nurse Practitioner

## 2020-09-28 LAB — URINALYSIS, ROUTINE W REFLEX MICROSCOPIC
Bilirubin Urine: NEGATIVE
Glucose, UA: NEGATIVE
Hyaline Cast: NONE SEEN /LPF
Ketones, ur: NEGATIVE
Nitrite: NEGATIVE
Specific Gravity, Urine: 1.01 (ref 1.001–1.035)
Squamous Epithelial / HPF: NONE SEEN /HPF (ref <=5)
WBC, UA: >=60 /HPF — AB (ref 0–5)
pH: 5.5 (ref 5.0–8.0)

## 2020-09-28 LAB — URINE CULTURE
MICRO NUMBER:: 12137718
Result:: NO GROWTH
SPECIMEN QUALITY:: ADEQUATE

## 2020-09-28 LAB — MICROSCOPIC MESSAGE

## 2020-09-29 NOTE — Progress Notes (Signed)
============================================================ ============================================================  -    U/A loaded with WBC & RBC - very suspect for UTI   - Culture returned "No Growth"  - Patient reports   " 80 % better "  - Advised complete ABx's

## 2020-09-30 ENCOUNTER — Ambulatory Visit
Admission: RE | Admit: 2020-09-30 | Discharge: 2020-09-30 | Disposition: A | Discharge status: Home / Self Care | Payer: Medicare Other | Source: Ambulatory Visit | Attending: Adult Health | Admitting: Adult Health

## 2020-09-30 ENCOUNTER — Other Ambulatory Visit: Payer: Self-pay

## 2020-09-30 DIAGNOSIS — E2839 Other primary ovarian failure: Secondary | ICD-10-CM

## 2020-10-03 ENCOUNTER — Encounter: Payer: Self-pay | Admitting: Adult Health

## 2020-10-03 DIAGNOSIS — M8580 Other specified disorders of bone density and structure, unspecified site: Secondary | ICD-10-CM | POA: Insufficient documentation

## 2020-11-07 ENCOUNTER — Other Ambulatory Visit: Payer: Self-pay

## 2020-11-07 MED ORDER — AMLODIPINE BESYLATE 5 MG PO TABS
5.0000 mg | ORAL_TABLET | Freq: Every day | ORAL | 2 refills | Status: DC
Start: 2020-11-07 — End: 2021-09-19

## 2020-11-25 ENCOUNTER — Ambulatory Visit (INDEPENDENT_AMBULATORY_CARE_PROVIDER_SITE_OTHER): Payer: Medicare Other

## 2020-11-25 DIAGNOSIS — I442 Atrioventricular block, complete: Secondary | ICD-10-CM

## 2020-11-28 LAB — CUP PACEART REMOTE DEVICE CHECK
Battery Remaining Longevity: 123 mo
Battery Voltage: 3.01 V
Brady Statistic AP VP Percent: 3.32 %
Brady Statistic AP VS Percent: 0 %
Brady Statistic AS VP Percent: 96.66 %
Brady Statistic AS VS Percent: 0.02 %
Brady Statistic RA Percent Paced: 3.31 %
Brady Statistic RV Percent Paced: 99.98 %
Date Time Interrogation Session: 20220916010855
Implantable Lead Implant Date: 20200102
Implantable Lead Implant Date: 20200102
Implantable Lead Location: 753859
Implantable Lead Location: 753860
Implantable Lead Model: 4076
Implantable Lead Model: 4076
Implantable Pulse Generator Implant Date: 20200102
Lead Channel Impedance Value: 342 Ohm
Lead Channel Impedance Value: 380 Ohm
Lead Channel Impedance Value: 456 Ohm
Lead Channel Impedance Value: 532 Ohm
Lead Channel Pacing Threshold Amplitude: 0.5 V
Lead Channel Pacing Threshold Amplitude: 0.625 V
Lead Channel Pacing Threshold Pulse Width: 0.4 ms
Lead Channel Pacing Threshold Pulse Width: 0.4 ms
Lead Channel Sensing Intrinsic Amplitude: 12 mV
Lead Channel Sensing Intrinsic Amplitude: 12 mV
Lead Channel Sensing Intrinsic Amplitude: 3.875 mV
Lead Channel Sensing Intrinsic Amplitude: 3.875 mV
Lead Channel Setting Pacing Amplitude: 1.5 V
Lead Channel Setting Pacing Amplitude: 2 V
Lead Channel Setting Pacing Pulse Width: 0.4 ms
Lead Channel Setting Sensing Sensitivity: 2 mV

## 2020-12-01 NOTE — Progress Notes (Signed)
Remote pacemaker transmission.   

## 2020-12-07 ENCOUNTER — Encounter: Payer: Medicare Other | Admitting: Internal Medicine

## 2020-12-25 ENCOUNTER — Encounter: Payer: Self-pay | Admitting: Internal Medicine

## 2020-12-25 NOTE — Patient Instructions (Signed)

## 2020-12-25 NOTE — Progress Notes (Signed)
Comprehensive Evaluation &  Examination  Future Appointments  Date Time Provider [place]  12/26/2020 11:00 AM Unk Bia, MD [facility] None  12/26/2021 11:00 AM Unk Thorner, MD [facility] None         This very nice 75 y.o. MWF presents for a comprehensive evaluation and management of multiple medical co-morbidities.  Patient has been followed for HTN, ASCAD, HLD, Prediabetes  and Vitamin D Deficiency.        HTN predates circa 2000.   In Jan 2020 , she had a PPM implanted for CHB.  In Mar 2020, She had PCA /Stent. Her Cardiologist is Dr Rayann Heman. Patient's BP has been controlled at home and patient denies any cardiac symptoms as chest pain, palpitations, shortness of breath, dizziness or ankle swelling. Today's BP is at goal - 130/70.        Patient's hyperlipidemia is controlled with diet and Rosuvastatin.  Patient denies myalgias or other medication SE's. Last lipids were at goal:  Lab Results  Component Value Date   CHOL 169 09/06/2020   HDL 66 09/06/2020   LDLCALC 78 09/06/2020   TRIG 145 09/06/2020   CHOLHDL 2.6 09/06/2020         Patient is monitored proactively for glucose intolerance and patient denies reactive hypoglycemic symptoms, visual blurring, diabetic polys or paresthesias. Last A1c was normal & at goal:  Lab Results  Component Value Date   HGBA1C 5.6 12/11/2019         Finally, patient has history of Vitamin D Deficiency ("32" /2020) and last Vitamin D was at goal:  Lab Results  Component Value Date   VD25OH 37 12/11/2019     Current Outpatient Medications on File Prior to Visit  Medication Sig   amLODipine 5 MG tablet Take 1 tablet daily.   VITAMIN C  1,000 mg  Take  daily.   aspirin EC 81 MG tablet Take  daily.   Vitamin D 10,000 Units Take  daily.   Lutein 20 MG CAPS Take 1 capsule daily.   metoprolol succinate -XL) 25 MG  Take daily.   rosuvastatin 20 MG tablet TAKE 1 TABLET   DAILY   zinc 50 MG tablet Take  daily.      Allergies  Allergen Reactions   Penicillins      Past Medical History:  Diagnosis Date   Abnormal glucose 11/18/2018   Allergy    penicillin   Arteriosclerotic heart disease (ASHD) 11/18/2018   s/p PCI 05/2018   Cardiac pacemaker 11/18/2018   Complete heart block ([place]) 11/18/2018   Essential hypertension 11/18/2018   Hyperlipidemia 2010   Hypertension 2000   Osteoporosis      Health Maintenance  Topic Date Due   TETANUS/TDAP  Never done   COVID-19 Vaccine (4 - Booster) 03/24/2020   INFLUENZA VACCINE  10/10/2020   Hepatitis C Screening  Completed   Zoster Vaccines- Shingrix  Completed   HPV VACCINES  Aged Out   DEXA SCAN  Discontinued     Immunization History  Administered Date(s) Administered   Fluad Quad -high Dose 12/30/2018   Influenza, High Dose  01/12/2020   Influenza,inj,quad 01/14/2017   PFIZER SARS-COV-2 Vacc 04/17/2019, 05/12/2019, 12/31/2019   Pneumococcal -13 04/12/2018   Pneumococcal -23 09/02/2019   Zoster Recombinat (Shingrix) 02/09/2018, 04/29/2018     Last Colon - 02/2017 in [place] and no f/u recc due to age.   Last MGM - 02/11/2020   Past Surgical History:  Procedure Laterality Date  CHOLECYSTECTOMY  2011   CORONARY ANGIOPLASTY WITH STENT PLACEMENT  05/2018   mohl  2009   face   PACEMAKER IMPLANT  03/2018   MDT pacemaker implanted in [place] Right 2016     Family History  Problem Relation Age of Onset   Colon cancer Mother    Diabetes Father    Hypertension Brother      Social History   Tobacco Use   Smoking status: Never   Smokeless tobacco: Never  Substance Use Topics   Drug use: Never      ROS Constitutional: Denies fever, chills, weight loss/gain, headaches, insomnia,  night sweats, and change in appetite. Does c/o fatigue. Eyes: Denies redness, blurred vision, diplopia, discharge, itchy, watery eyes.  ENT: Denies discharge, congestion, post nasal drip, epistaxis, sore throat, earache, hearing loss,  dental pain, Tinnitus, Vertigo, Sinus pain, snoring.  Cardio: Denies chest pain, palpitations, irregular heartbeat, syncope, dyspnea, diaphoresis, orthopnea, PND, claudication, edema Respiratory: denies cough, dyspnea, DOE, pleurisy, hoarseness, laryngitis, wheezing.  Gastrointestinal: Denies dysphagia, heartburn, reflux, water brash, pain, cramps, nausea, vomiting, bloating, diarrhea, constipation, hematemesis, melena, hematochezia, jaundice, hemorrhoids Genitourinary: Denies dysuria, frequency, urgency, nocturia, hesitancy, discharge, hematuria, flank pain Breast: Breast lumps, nipple discharge, bleeding.  Musculoskeletal: Denies arthralgia, myalgia, stiffness, Jt. Swelling, pain, limp, and strain/sprain. Denies falls. Skin: Denies puritis, rash, hives, warts, acne, eczema, changing in skin lesion Neuro: No weakness, tremor, incoordination, spasms, paresthesia, pain Psychiatric: Denies confusion, memory loss, sensory loss. Denies Depression. Endocrine: Denies change in weight, skin, hair change, nocturia, and paresthesia, diabetic polys, visual blurring, hyper / hypo glycemic episodes.  Heme/Lymph: No excessive bleeding, bruising, enlarged lymph nodes.  Physical Exam  BP 130/70   Pulse 84   Temp 98 F (36.7 C)   Resp 16   Ht 5\' 2"  (1.575 m)   Wt 169 lb (76.7 kg)   SpO2 96%   BMI 30.91 kg/m   General Appearance: Well nourished, well groomed and in no apparent distress.  Eyes: PERRLA, EOMs, conjunctiva no swelling or erythema, normal fundi and vessels. Sinuses: No frontal/maxillary tenderness ENT/Mouth: EACs patent / TMs  nl. Nares clear without erythema, swelling, mucoid exudates. Oral hygiene is good. No erythema, swelling, or exudate. Tongue normal, non-obstructing. Tonsils not swollen or erythematous. Hearing normal.  Neck: Supple, thyroid not palpable. No bruits, nodes or JVD. Respiratory: Respiratory effort normal.  BS equal and clear bilateral without rales, rhonci, wheezing or  stridor. Cardio: Heart sounds are normal with regular rate and rhythm and no murmurs, rubs or gallops. Peripheral pulses are normal and equal bilaterally without edema. No aortic or femoral bruits. Chest: symmetric with normal excursions and percussion. Breasts: Patient deferred Exam to up coming MGM.  Abdomen: Flat, soft with bowel sounds active. Nontender, no guarding, rebound, hernias, masses, or organomegaly.  Lymphatics: Non tender without lymphadenopathy.  Musculoskeletal: Full ROM all peripheral extremities, joint stability, 5/5 strength, and normal gait. Skin: Warm and dry without rashes, lesions, cyanosis, clubbing or  ecchymosis.  Neuro: Cranial nerves intact, reflexes equal bilaterally. Normal muscle tone, no cerebellar symptoms. Sensation intact.  Pysch: Alert and oriented X 3, normal affect, Insight and Judgment appropriate.    Assessment and Plan   1. Essential hypertension  - EKG 12-Lead - Urinalysis, Routine w reflex microscopic - Microalbumin / creatinine urine ratio - CBC with Differential/Platelet - COMPLETE METABOLIC PANEL WITH GFR - Magnesium - TSH  2. Hyperlipidemia, mixed  - EKG 12-Lead - Lipid panel - TSH  3. Abnormal  glucose  - EKG 12-Lead - Hemoglobin A1c - Insulin, random  4. Vitamin D deficiency  - VITAMIN D 25 Hydroxy   5. Arteriosclerotic heart disease (ASHD)  - EKG 12-Lead - Lipid panel  6. Complete heart block ([facility])  - EKG 12-Lead  7. Cardiac pacemaker  - EKG 12-Lead  8. Screening for colorectal cancer  - POC Hemoccult Bld/Stl   9. Screening for ischemic heart disease  - EKG 12-Lead  10. FHx: heart disease  - EKG 12-Lead  11. Medication management  - Urinalysis, Routine w reflex microscopic - Microalbumin / creatinine urine ratio - CBC with Differential/Platelet - COMPLETE METABOLIC PANEL WITH GFR - Magnesium - Lipid panel - TSH - Hemoglobin A1c - Insulin, random - VITAMIN D 25 Hydroxy          Patient was  counseled in prudent diet to achieve/maintain BMI less than 25 for weight control, BP monitoring, regular exercise and medications. Discussed med's effects and SE's. Screening labs and tests as requested with regular follow-up as recommended. Over 40 minutes of exam, counseling, chart review and high complex critical decision making was performed.   Kirtland Bouchard, MD

## 2020-12-26 ENCOUNTER — Ambulatory Visit (INDEPENDENT_AMBULATORY_CARE_PROVIDER_SITE_OTHER): Payer: Medicare Other | Admitting: Internal Medicine

## 2020-12-26 ENCOUNTER — Encounter: Payer: Self-pay | Admitting: Internal Medicine

## 2020-12-26 ENCOUNTER — Other Ambulatory Visit: Payer: Self-pay

## 2020-12-26 VITALS — BP 130/70 | HR 84 | Temp 98.0°F | Resp 16 | Ht 62.0 in | Wt 169.0 lb

## 2020-12-26 DIAGNOSIS — E559 Vitamin D deficiency, unspecified: Secondary | ICD-10-CM | POA: Diagnosis not present

## 2020-12-26 DIAGNOSIS — Z79899 Other long term (current) drug therapy: Secondary | ICD-10-CM

## 2020-12-26 DIAGNOSIS — I442 Atrioventricular block, complete: Secondary | ICD-10-CM

## 2020-12-26 DIAGNOSIS — I2510 Atherosclerotic heart disease of native coronary artery without angina pectoris: Secondary | ICD-10-CM | POA: Diagnosis not present

## 2020-12-26 DIAGNOSIS — R7309 Other abnormal glucose: Secondary | ICD-10-CM | POA: Diagnosis not present

## 2020-12-26 DIAGNOSIS — I10 Essential (primary) hypertension: Secondary | ICD-10-CM

## 2020-12-26 DIAGNOSIS — Z950 Presence of cardiac pacemaker: Secondary | ICD-10-CM | POA: Diagnosis not present

## 2020-12-26 DIAGNOSIS — Z136 Encounter for screening for cardiovascular disorders: Secondary | ICD-10-CM

## 2020-12-26 DIAGNOSIS — Z23 Encounter for immunization: Secondary | ICD-10-CM

## 2020-12-26 DIAGNOSIS — E782 Mixed hyperlipidemia: Secondary | ICD-10-CM

## 2020-12-26 DIAGNOSIS — Z8249 Family history of ischemic heart disease and other diseases of the circulatory system: Secondary | ICD-10-CM

## 2020-12-26 DIAGNOSIS — Z1211 Encounter for screening for malignant neoplasm of colon: Secondary | ICD-10-CM

## 2020-12-27 LAB — CBC WITH DIFFERENTIAL/PLATELET
Absolute Monocytes: 742 cells/uL (ref 200–950)
Basophils Absolute: 70 cells/uL (ref 0–200)
Basophils Relative: 1.2 %
Eosinophils Absolute: 394 cells/uL (ref 15–500)
Eosinophils Relative: 6.8 %
HCT: 41.6 % (ref 35.0–45.0)
Hemoglobin: 13.6 g/dL (ref 11.7–15.5)
Lymphs Abs: 1978 cells/uL (ref 850–3900)
MCH: 29.8 pg (ref 27.0–33.0)
MCHC: 32.7 g/dL (ref 32.0–36.0)
MCV: 91 fL (ref 80.0–100.0)
MPV: 10.6 fL (ref 7.5–12.5)
Monocytes Relative: 12.8 %
Neutro Abs: 2616 cells/uL (ref 1500–7800)
Neutrophils Relative %: 45.1 %
Platelets: 244 10*3/uL (ref 140–400)
RBC: 4.57 10*6/uL (ref 3.80–5.10)
RDW: 12.2 % (ref 11.0–15.0)
Total Lymphocyte: 34.1 %
WBC: 5.8 10*3/uL (ref 3.8–10.8)

## 2020-12-27 LAB — URINALYSIS, ROUTINE W REFLEX MICROSCOPIC
Bacteria, UA: NONE SEEN /HPF
Bilirubin Urine: NEGATIVE
Glucose, UA: NEGATIVE
Hgb urine dipstick: NEGATIVE
Hyaline Cast: NONE SEEN /LPF
Ketones, ur: NEGATIVE
Nitrite: NEGATIVE
Protein, ur: NEGATIVE
RBC / HPF: NONE SEEN /HPF (ref 0–2)
Specific Gravity, Urine: 1.009 (ref 1.001–1.035)
Squamous Epithelial / HPF: NONE SEEN /HPF (ref <=5)
WBC, UA: NONE SEEN /HPF (ref 0–5)
pH: 5.5 (ref 5.0–8.0)

## 2020-12-27 LAB — LIPID PANEL
Cholesterol: 133 mg/dL (ref <200)
HDL: 53 mg/dL (ref >=50)
LDL Cholesterol (Calc): 57 mg/dL (calc)
Non-HDL Cholesterol (Calc): 80 mg/dL (calc) (ref <130)
Total CHOL/HDL Ratio: 2.5 (calc) (ref <5.0)
Triglycerides: 144 mg/dL (ref <150)

## 2020-12-27 LAB — INSULIN, RANDOM: Insulin: 6.5 u[IU]/mL

## 2020-12-27 LAB — COMPLETE METABOLIC PANEL WITH GFR
AG Ratio: 2.3 (calc) (ref 1.0–2.5)
ALT: 12 U/L (ref 6–29)
AST: 15 U/L (ref 10–35)
Albumin: 4.5 g/dL (ref 3.6–5.1)
Alkaline phosphatase (APISO): 68 U/L (ref 37–153)
BUN: 10 mg/dL (ref 7–25)
CO2: 27 mmol/L (ref 20–32)
Calcium: 9.6 mg/dL (ref 8.6–10.4)
Chloride: 104 mmol/L (ref 98–110)
Creat: 0.84 mg/dL (ref 0.60–1.00)
Globulin: 2 g/dL (calc) (ref 1.9–3.7)
Glucose, Bld: 97 mg/dL (ref 65–99)
Potassium: 4.2 mmol/L (ref 3.5–5.3)
Sodium: 141 mmol/L (ref 135–146)
Total Bilirubin: 0.9 mg/dL (ref 0.2–1.2)
Total Protein: 6.5 g/dL (ref 6.1–8.1)
eGFR: 72 mL/min/{1.73_m2} (ref >=60)

## 2020-12-27 LAB — MICROALBUMIN / CREATININE URINE RATIO
Creatinine, Urine: 68 mg/dL (ref 20–275)
Microalb Creat Ratio: 7 mcg/mg creat (ref <30)
Microalb, Ur: 0.5 mg/dL

## 2020-12-27 LAB — HEMOGLOBIN A1C
Hgb A1c MFr Bld: 5.5 % of total Hgb (ref <5.7)
Mean Plasma Glucose: 111 mg/dL
eAG (mmol/L): 6.2 mmol/L

## 2020-12-27 LAB — MICROSCOPIC MESSAGE

## 2020-12-27 LAB — MAGNESIUM: Magnesium: 2.1 mg/dL (ref 1.5–2.5)

## 2020-12-27 LAB — TSH: TSH: 3.69 mIU/L (ref 0.40–4.50)

## 2020-12-27 LAB — VITAMIN D 25 HYDROXY (VIT D DEFICIENCY, FRACTURES): Vit D, 25-Hydroxy: 91 ng/mL (ref 30–100)

## 2020-12-27 NOTE — Progress Notes (Signed)
============================================================ -   Test results slightly outside the reference range are not unusual. If there is anything important, I will review this with you,  otherwise it is considered normal test values.  If you have further questions,  please do not hesitate to contact me at the office or via My Chart.  ============================================================ ============================================================  -  Total Chol = 133    & LDL Chol = 57     - Both  Excellent   - Very low risk for Heart Attack  / Stroke ============================================================ ============================================================  - A1c - Normal -No Diabetes  - Great !  ============================================================ ============================================================  -  Vitamin D = 91   -   Excellent ! ============================================================ ============================================================  -  All Else - CBC - Kidneys - U/A - Electrolytes - Liver - Magnesium & Thyroid    - all  Normal / OK ============================================================ ============================================================  -  Keep up the [place] Work  ! ============================================================ ============================================================

## 2020-12-28 ENCOUNTER — Other Ambulatory Visit: Payer: Self-pay | Admitting: Internal Medicine

## 2020-12-28 DIAGNOSIS — Z1231 Encounter for screening mammogram for malignant neoplasm of breast: Secondary | ICD-10-CM

## 2021-01-08 ENCOUNTER — Other Ambulatory Visit: Payer: Self-pay | Admitting: Internal Medicine

## 2021-01-08 MED ORDER — DEXAMETHASONE 4 MG PO TABS: ORAL_TABLET | ORAL | 0 refills | Status: DC

## 2021-01-23 ENCOUNTER — Other Ambulatory Visit: Payer: Self-pay | Admitting: Adult Health

## 2021-02-21 ENCOUNTER — Ambulatory Visit
Admission: RE | Admit: 2021-02-21 | Discharge: 2021-02-21 | Disposition: A | Discharge status: Home / Self Care | Payer: Medicare Other | Source: Ambulatory Visit | Attending: Internal Medicine | Admitting: Internal Medicine

## 2021-02-21 DIAGNOSIS — Z1231 Encounter for screening mammogram for malignant neoplasm of breast: Secondary | ICD-10-CM

## 2021-02-24 ENCOUNTER — Ambulatory Visit (INDEPENDENT_AMBULATORY_CARE_PROVIDER_SITE_OTHER): Payer: Medicare Other

## 2021-02-24 DIAGNOSIS — I442 Atrioventricular block, complete: Secondary | ICD-10-CM

## 2021-02-24 LAB — CUP PACEART REMOTE DEVICE CHECK
Battery Remaining Longevity: 117 mo
Battery Voltage: 3 V
Brady Statistic AP VP Percent: 11.37 %
Brady Statistic AP VS Percent: 0 %
Brady Statistic AS VP Percent: 88.6 %
Brady Statistic AS VS Percent: 0.03 %
Brady Statistic RA Percent Paced: 11.37 %
Brady Statistic RV Percent Paced: 99.97 %
Date Time Interrogation Session: 20221216050524
Implantable Lead Implant Date: 20200102
Implantable Lead Implant Date: 20200102
Implantable Lead Location: 753859
Implantable Lead Location: 753860
Implantable Lead Model: 4076
Implantable Lead Model: 4076
Implantable Pulse Generator Implant Date: 20200102
Lead Channel Impedance Value: 323 Ohm
Lead Channel Impedance Value: 342 Ohm
Lead Channel Impedance Value: 399 Ohm
Lead Channel Impedance Value: 475 Ohm
Lead Channel Pacing Threshold Amplitude: 0.625 V
Lead Channel Pacing Threshold Amplitude: 0.625 V
Lead Channel Pacing Threshold Pulse Width: 0.4 ms
Lead Channel Pacing Threshold Pulse Width: 0.4 ms
Lead Channel Sensing Intrinsic Amplitude: 12 mV
Lead Channel Sensing Intrinsic Amplitude: 12 mV
Lead Channel Sensing Intrinsic Amplitude: 2.625 mV
Lead Channel Sensing Intrinsic Amplitude: 2.625 mV
Lead Channel Setting Pacing Amplitude: 1.5 V
Lead Channel Setting Pacing Amplitude: 2 V
Lead Channel Setting Pacing Pulse Width: 0.4 ms
Lead Channel Setting Sensing Sensitivity: 2 mV

## 2021-03-08 NOTE — Progress Notes (Signed)
Remote pacemaker transmission.   

## 2021-03-12 HISTORY — PX: CATARACT EXTRACTION: SUR2

## 2021-03-27 NOTE — Progress Notes (Signed)
3 MONTH FOLLOW UP  Assessment:    Complete heart block/ S/p pacemaker Now follows with Dr. Rayann Heman Doing well s/p pacemaker Monitor   Arteriosclerotic heart disease S/p stent 05/2018; completed DAPT x 1 year, now on bASA Denies CP Follows with cardiology Control blood pressure, cholesterol, glucose, encourage lifestyle/exercise   Hypertension Well controlled with current medications  Monitor blood pressure at home; patient to call if consistently greater than 130/80 Continue DASH diet.   Reminder to go to the ER if any CP, SOB, nausea, dizziness, severe HA, changes vision/speech, left arm numbness and tingling and jaw pain. Check CBC  Cholesterol Continue statin therapy for LDL  LDL goal of <70 reviewed; newly increased to 10 mg rosuvastatin daily  Continue low cholesterol diet and exercise.  Check lipid panel.  Check CMP Check TSH  Other abnormal glucse Recent A1Cs at goal Discussed diet/exercise, weight management  Defer A1C; check CMP for serum glucose, monitor weight trends  Overweight - BMI 29.0-29.9 Long discussion about weight loss, diet, and exercise Recommended diet heavy in fruits and veggies and low in animal meats, cheeses, and dairy products, appropriate calorie intake Discussed ideal weight for height  and initial weight goal (<150 lb) Patient will work on eating out less and walking once moves to new home Will follow up in 3 months  Vitamin D Def Below goal at last visit; newly taking 10000 IU daily  continue to recommend supplementation to maintain goal of 60-100 Defer Vit D level, check at follow up  Medication management Continued   Over 40 minutes of exam, counseling, chart review and critical decision making was performed Future Appointments  Date Time Provider [place]  05/26/2021  7:10 AM [facility] DEVICE REMOTES [facility]  06/26/2021 11:30 AM Unk Brechtel, MD [facility] None  08/25/2021  7:10 AM [facility]  DEVICE REMOTES [facility]  12/26/2021 11:00 AM Unk Suarez, MD [facility] None     Subjective:  Kristy [name] is a 76 y.o. female who presents for  3 month follow up.    She is following with [facility] Dr. Martin Majestic due to history of skin cancer, had moh's of left temple remotely. No recent concerns. She has had several ares removed on her face but all non cancerous. She is going to be using fluorouracil on her forehead.   BMI is Body mass index is 29.81 kg/m., she has not been working on diet and exercise. Had covid in November and has not been exercising as much.   Wt Readings from Last 3 Encounters:  03/28/21 163 lb (73.9 kg)  12/26/20 169 lb (76.7 kg)  09/06/20 169 lb 9.6 oz (76.9 kg)   Hx of complete heart block in 03/2018 s/p pacemaker and continues to do well; Dr. Rayann Heman is no longer practicing.  She had a (+) Nuclear stress test early 2020 and underwent cath and had a stent implanted (Mar 2020) and has done well since, completed 1 year of DAPT, now on bASA only.   She has had elevated blood pressure since the 90s. Her blood pressure has been controlled at home, today their BP is BP: 112/64 BP Readings from Last 3 Encounters:  03/28/21 112/64  12/26/20 130/70  09/06/20 134/78    She does not workout. She denies chest pain, shortness of breath, dizziness.   She is on cholesterol medication (rosuvastatin 20 mg daily) and denies myalgias. Her cholesterol is at goal. The cholesterol last visit was:   Lab Results  Component Value Date  CHOL 133 12/26/2020   HDL 53 12/26/2020   LDLCALC 57 12/26/2020   TRIG 144 12/26/2020   CHOLHDL 2.5 12/26/2020    She has not been working on diet and exercise for glucose management. Last A1C in the office was:  Lab Results  Component Value Date   HGBA1C 5.5 12/26/2020   Last GFR: Lab Results  Component Value Date   GFRNONAA 66 09/06/2020   Patient is on Vitamin D supplement.   Lab Results   Component Value Date   VD25OH 91 12/26/2020      Medication Review: Current Outpatient Medications on File Prior to Visit  Medication Sig Dispense Refill   amLODipine (NORVASC) 5 MG tablet Take 1 tablet (5 mg total) by mouth daily. 90 tablet 2   Ascorbic Acid (VITAMIN C PO) Take 1,000 mg by mouth daily.     aspirin EC 81 MG tablet Take 81 mg by mouth daily.     CHOLECALCIFEROL PO Take 10,000 Units by mouth daily.     Lutein 20 MG CAPS Take 1 capsule by mouth daily.     metoprolol succinate (TOPROL-XL) 25 MG 24 hr tablet Take 25 mg by mouth daily.     rosuvastatin (CRESTOR) 20 MG tablet TAKE 1 TABLET BY MOUTH  DAILY 90 tablet 3   zinc gluconate 50 MG tablet Take 50 mg by mouth daily.     dexamethasone (DECADRON) 4 MG tablet Take 1 tab 3 x /day for 2 days,      then 2 x /day for 2  Days,     then 1 tab daily (Patient not taking: Reported on 03/28/2021) 13 tablet 0   nitrofurantoin, macrocrystal-monohydrate, (MACROBID) 100 MG capsule Take  1 capsule  2 x /day  with Food for UTI (Patient not taking: Reported on 12/26/2020) 14 capsule 0   No current facility-administered medications on file prior to visit.    Allergies  Allergen Reactions   Penicillins     Current Problems (verified) Patient Active Problem List   Diagnosis Date Noted   Osteopenia 10/03/2020   Obesity (BMI 30.0-34.9) 09/01/2019   History of skin cancer 02/23/2019   Arteriosclerotic heart disease (ASHD) 11/18/2018   Cardiac pacemaker 11/18/2018   Complete heart block ([place]) 11/18/2018   Vitamin D deficiency 11/18/2018   Abnormal glucose 11/18/2018   Essential hypertension 11/18/2018   Hyperlipidemia 2010    SURGICAL HISTORY She  has a past surgical history that includes Cholecystectomy (2011); Rotator cuff repair (Right, 2016); mohl (2009); PACEMAKER IMPLANT (03/2018); and Coronary angioplasty with stent (05/2018). FAMILY HISTORY Her family history includes Colon cancer in her mother; Diabetes in her father;  Hypertension in her brother. SOCIAL HISTORY She  reports that she has never smoked. She has never used smokeless tobacco. She reports that she does not use drugs.   Review of Systems  Constitutional:  Negative for malaise/fatigue and weight loss.  HENT:  Negative for hearing loss and tinnitus.   Eyes:  Negative for blurred vision and double vision.  Respiratory:  Negative for cough, sputum production, shortness of breath and wheezing.   Cardiovascular:  Negative for chest pain, palpitations, orthopnea, claudication, leg swelling and PND.  Gastrointestinal:  Negative for abdominal pain, blood in stool, constipation, diarrhea, heartburn, melena, nausea and vomiting.  Genitourinary: Negative.   Musculoskeletal:  Negative for falls, joint pain and myalgias.  Skin:  Negative for rash.  Neurological:  Negative for dizziness, tingling, sensory change, weakness and headaches.  Endo/Heme/Allergies:  Negative for polydipsia.  Psychiatric/Behavioral: Negative.  Negative for depression, memory loss, substance abuse and suicidal ideas. The patient is not nervous/anxious and does not have insomnia.   All other systems reviewed and are negative.   Objective:     Today's Vitals   03/28/21 1057  BP: 112/64  Pulse: 94  Temp: (!) 97.5 F (36.4 C)  SpO2: 98%  Weight: 163 lb (73.9 kg)   Body mass index is 29.81 kg/m.  General appearance: alert, no distress, WD/WN, female HEENT: normocephalic, sclerae anicteric, TMs pearly, nares patent, no discharge or erythema, pharynx normal Oral cavity: MMM, no lesions Neck: supple, no lymphadenopathy, no thyromegaly, no masses Heart: RRR, normal S1, S2, no murmurs. Pacemaker in place Lungs: CTA bilaterally, no wheezes, rhonchi, or rales Abdomen: +bs, soft, non tender, non distended, no masses, no hepatomegaly, no splenomegaly Musculoskeletal: nontender, no swelling, no obvious deformity Extremities: no edema, no cyanosis, no clubbing Pulses: 2+ symmetric,  upper and lower extremities, normal cap refill Neurological: alert, oriented x 3, CN2-12 intact, strength normal upper extremities and lower extremities, sensation normal throughout, DTRs 2+ throughout, no cerebellar signs, gait normal Psychiatric: normal affect, behavior normal, pleasant    Magda Bernheim, NP   03/28/2021

## 2021-03-28 ENCOUNTER — Other Ambulatory Visit: Payer: Self-pay

## 2021-03-28 ENCOUNTER — Ambulatory Visit (INDEPENDENT_AMBULATORY_CARE_PROVIDER_SITE_OTHER): Payer: Medicare Other | Admitting: Nurse Practitioner

## 2021-03-28 ENCOUNTER — Encounter: Payer: Self-pay | Admitting: Nurse Practitioner

## 2021-03-28 VITALS — BP 112/64 | HR 94 | Temp 97.5°F | Wt 163.0 lb

## 2021-03-28 DIAGNOSIS — E782 Mixed hyperlipidemia: Secondary | ICD-10-CM | POA: Diagnosis not present

## 2021-03-28 DIAGNOSIS — E559 Vitamin D deficiency, unspecified: Secondary | ICD-10-CM

## 2021-03-28 DIAGNOSIS — E669 Obesity, unspecified: Secondary | ICD-10-CM

## 2021-03-28 DIAGNOSIS — Z6829 Body mass index (BMI) 29.0-29.9, adult: Secondary | ICD-10-CM

## 2021-03-28 DIAGNOSIS — R7309 Other abnormal glucose: Secondary | ICD-10-CM

## 2021-03-28 DIAGNOSIS — E663 Overweight: Secondary | ICD-10-CM

## 2021-03-28 DIAGNOSIS — I2510 Atherosclerotic heart disease of native coronary artery without angina pectoris: Secondary | ICD-10-CM

## 2021-03-28 DIAGNOSIS — Z79899 Other long term (current) drug therapy: Secondary | ICD-10-CM

## 2021-03-28 DIAGNOSIS — I442 Atrioventricular block, complete: Secondary | ICD-10-CM | POA: Diagnosis not present

## 2021-03-28 DIAGNOSIS — I10 Essential (primary) hypertension: Secondary | ICD-10-CM

## 2021-03-28 MED ORDER — METOPROLOL SUCCINATE ER 25 MG PO TB24: 25.0000 mg | ORAL_TABLET | Freq: Every day | ORAL | 3 refills | Status: DC

## 2021-03-29 LAB — CBC WITH DIFFERENTIAL/PLATELET
Absolute Monocytes: 761 cells/uL (ref 200–950)
Basophils Absolute: 83 cells/uL (ref 0–200)
Basophils Relative: 1.4 %
Eosinophils Absolute: 507 cells/uL — ABNORMAL HIGH (ref 15–500)
Eosinophils Relative: 8.6 %
HCT: 40.8 % (ref 35.0–45.0)
Hemoglobin: 13.4 g/dL (ref 11.7–15.5)
Lymphs Abs: 1652 cells/uL (ref 850–3900)
MCH: 30 pg (ref 27.0–33.0)
MCHC: 32.8 g/dL (ref 32.0–36.0)
MCV: 91.3 fL (ref 80.0–100.0)
MPV: 10.4 fL (ref 7.5–12.5)
Monocytes Relative: 12.9 %
Neutro Abs: 2897 cells/uL (ref 1500–7800)
Neutrophils Relative %: 49.1 %
Platelets: 253 10*3/uL (ref 140–400)
RBC: 4.47 10*6/uL (ref 3.80–5.10)
RDW: 12.4 % (ref 11.0–15.0)
Total Lymphocyte: 28 %
WBC: 5.9 10*3/uL (ref 3.8–10.8)

## 2021-03-29 LAB — COMPLETE METABOLIC PANEL WITH GFR
AG Ratio: 2.4 (calc) (ref 1.0–2.5)
ALT: 15 U/L (ref 6–29)
AST: 18 U/L (ref 10–35)
Albumin: 4.5 g/dL (ref 3.6–5.1)
Alkaline phosphatase (APISO): 72 U/L (ref 37–153)
BUN: 10 mg/dL (ref 7–25)
CO2: 30 mmol/L (ref 20–32)
Calcium: 9.7 mg/dL (ref 8.6–10.4)
Chloride: 104 mmol/L (ref 98–110)
Creat: 0.86 mg/dL (ref 0.60–1.00)
Globulin: 1.9 g/dL (calc) (ref 1.9–3.7)
Glucose, Bld: 108 mg/dL — ABNORMAL HIGH (ref 65–99)
Potassium: 4.5 mmol/L (ref 3.5–5.3)
Sodium: 139 mmol/L (ref 135–146)
Total Bilirubin: 0.7 mg/dL (ref 0.2–1.2)
Total Protein: 6.4 g/dL (ref 6.1–8.1)
eGFR: 70 mL/min/{1.73_m2} (ref >=60)

## 2021-03-29 LAB — LIPID PANEL
Cholesterol: 161 mg/dL (ref <200)
HDL: 65 mg/dL (ref >=50)
LDL Cholesterol (Calc): 71 mg/dL (calc)
Non-HDL Cholesterol (Calc): 96 mg/dL (calc) (ref <130)
Total CHOL/HDL Ratio: 2.5 (calc) (ref <5.0)
Triglycerides: 172 mg/dL — ABNORMAL HIGH (ref <150)

## 2021-03-29 LAB — TSH: TSH: 2.86 mIU/L (ref 0.40–4.50)

## 2021-05-26 ENCOUNTER — Ambulatory Visit (INDEPENDENT_AMBULATORY_CARE_PROVIDER_SITE_OTHER): Payer: Medicare Other

## 2021-05-26 DIAGNOSIS — I442 Atrioventricular block, complete: Secondary | ICD-10-CM

## 2021-05-26 LAB — CUP PACEART REMOTE DEVICE CHECK
Battery Remaining Longevity: 115 mo
Battery Voltage: 3 V
Brady Statistic AP VP Percent: 4.17 %
Brady Statistic AP VS Percent: 0 %
Brady Statistic AS VP Percent: 95.82 %
Brady Statistic AS VS Percent: 0.01 %
Brady Statistic RA Percent Paced: 4.16 %
Brady Statistic RV Percent Paced: 99.99 %
Date Time Interrogation Session: 20230317095853
Implantable Lead Implant Date: 20200102
Implantable Lead Implant Date: 20200102
Implantable Lead Location: 753859
Implantable Lead Location: 753860
Implantable Lead Model: 4076
Implantable Lead Model: 4076
Implantable Pulse Generator Implant Date: 20200102
Lead Channel Impedance Value: 323 Ohm
Lead Channel Impedance Value: 342 Ohm
Lead Channel Impedance Value: 437 Ohm
Lead Channel Impedance Value: 494 Ohm
Lead Channel Pacing Threshold Amplitude: 0.5 V
Lead Channel Pacing Threshold Amplitude: 0.625 V
Lead Channel Pacing Threshold Pulse Width: 0.4 ms
Lead Channel Pacing Threshold Pulse Width: 0.4 ms
Lead Channel Sensing Intrinsic Amplitude: 12 mV
Lead Channel Sensing Intrinsic Amplitude: 12 mV
Lead Channel Sensing Intrinsic Amplitude: 3.125 mV
Lead Channel Sensing Intrinsic Amplitude: 3.125 mV
Lead Channel Setting Pacing Amplitude: 1.5 V
Lead Channel Setting Pacing Amplitude: 2 V
Lead Channel Setting Pacing Pulse Width: 0.4 ms
Lead Channel Setting Sensing Sensitivity: 2 mV

## 2021-06-01 NOTE — Progress Notes (Signed)
Remote pacemaker transmission.   

## 2021-06-25 NOTE — Progress Notes (Signed)
? ?Future Appointments  ?Date Time Provider Department  ?06/26/2021 11:30 AM Unk Puga, MD [facility]  ?12/26/2021 11:00 AM Unk Cassetta, MD [facility]  ? ? ?History of Present Illness: ? ? ?    This very nice 76 y.o. Kristy [name]  presents for 6 month follow up with HTN, ASCAD/PPM, HLD, Prediabetes  and Vitamin D Deficiency. ? ? ?    Patient is treated for HTN  since 2000 & BP has been controlled at home.  In Jan 2020, she had a PPM for CHB. In goal -  138/70. Patient has had no complaints of any cardiac type chest pain, palpitations, dyspnea / orthopnea / PND, dizziness, claudication, or dependent edema. ? ? ?    Hyperlipidemia is controlled with diet & Rosuvastatin.  Patient denies myalgias or other med SE?s. Last Lipids were at goal except slightly elevated  Trig's: ? ?Lab Results  ?Component Value Date  ? CHOL 161 03/28/2021  ? HDL 65 03/28/2021  ? [place] 71 03/28/2021  ? TRIG 172 (H) 03/28/2021  ? CHOLHDL 2.5 03/28/2021  ? ? ? ?Also,  the patient is monitored proactively for glucose intolerance  and has had no symptoms of reactive hypoglycemia, diabetic polys, paresthesias or visual blurring.  Last A1c was normal & at goal : ? ?Lab Results  ?Component Value Date  ? HGBA1C 5.5 12/26/2020  ? ?    ? ?                                                  Further, the patient also has history of Vitamin D Deficiency ("32" /2020) and supplements vitamin D . Last vitamin D was ? ? ?Lab Results  ?Component Value Date  ? VD25OH 91 12/26/2020  ? ? ? ?Current Outpatient Medications on File Prior to Visit  ?Medication Sig  ? amLODipine   5 MG tablet Take 1 tablet  daily.  ? VITAMIN C 1,000 mg  Take daily.  ? aspirin EC 81 MG tablet Take daily.  ? CHOLECALCIFEROL 10,000 Units  Take by mouth daily.  ? Lutein 20 MG CAPS Take 1 capsule daily.  ? metoprolol succinate -XL 25 MG  Take 1 tablet daily.  ? rosuvastatin  20 MG tablet TAKE 1 TABLET   DAILY  ? zinc Kristy MG tablet Take  daily.  ? ? ? ? ?Allergies  ?Allergen Reactions  ?  Penicillins   ? ? ? ?PMHx:   ?Past Medical History:  ?Diagnosis Date  ? Abnormal glucose 11/18/2018  ? Allergy   ? penicillin  ? Arteriosclerotic heart disease (ASHD) 11/18/2018  ? s/p PCI 05/2018  ? Cardiac pacemaker 11/18/2018  ? Complete heart block ([place]) 11/18/2018  ? Essential hypertension 11/18/2018  ? Hyperlipidemia 2010  ? Hypertension 2000  ? Osteoporosis   ? ? ? ?Immunization History  ?Administered Date(s) Administered  ? Fluad Quad (high Dose ) 12/30/2018  ? Influenza, High Dose  01/12/2020, 12/26/2020  ? Influenza,inj,quad 01/14/2017  ? PFIZER-SARS-COV-2 Vacc 04/17/2019, 05/12/2019, 12/31/2019  ? Pneumococcal -13 04/12/2018  ? Pneumococcal -23 09/02/2019  ? Zoster Recombinat (Shingrix) 02/09/2018, 04/29/2018  ? ? ? ?Past Surgical History:  ?Procedure Laterality Date  ? CHOLECYSTECTOMY  2011  ? CORONARY ANGIOPLASTY WITH STENT PLACEMENT  05/2018  ? mohl  2009  ? face  ? PACEMAKER IMPLANT  03/2018  ? MDT pacemaker  implanted in [place] Right 2016  ? ? ?FHx:    Reviewed / unchanged ? ?SHx:    Reviewed / unchanged  ? ?Systems Review: ? ?Constitutional: Denies fever, chills, wt changes, headaches, insomnia, fatigue, night sweats, change in appetite. ?Eyes: Denies redness, blurred vision, diplopia, discharge, itchy, watery eyes.  ?ENT: Denies discharge, congestion, post nasal drip, epistaxis, sore throat, earache, hearing loss, dental pain, tinnitus, vertigo, sinus pain, snoring.  ?CV: Denies chest pain, palpitations, irregular heartbeat, syncope, dyspnea, diaphoresis, orthopnea, PND, claudication or edema. ?Respiratory: denies cough, dyspnea, DOE, pleurisy, hoarseness, laryngitis, wheezing.  ?Gastrointestinal: Denies dysphagia, odynophagia, heartburn, reflux, water brash, abdominal pain or cramps, nausea, vomiting, bloating, diarrhea, constipation, hematemesis, melena, hematochezia  or hemorrhoids. ?Genitourinary: Denies dysuria, frequency, urgency, nocturia, hesitancy, discharge, hematuria or flank  pain. ?Musculoskeletal: Denies arthralgias, myalgias, stiffness, jt. swelling, pain, limping or strain/sprain.  ?Skin: Denies pruritus, rash, hives, warts, acne, eczema or change in skin lesion(s). ?Neuro: No weakness, tremor, incoordination, spasms, paresthesia or pain. ?Psychiatric: Denies confusion, memory loss or sensory loss. ?Endo: Denies change in weight, skin or hair change.  ?Heme/Lymph: No excessive bleeding, bruising or enlarged lymph nodes. ? ?Physical Exam ? ?BP 138/70   Pulse 73   Temp 97.9 ?F (36.6 ?C)   Resp 16   Ht '5\' 2"'$  (1.575 m)   Wt 163 lb 3.2 oz (74 kg)   SpO2 97%   BMI 29.85 kg/m?  ? ?Appears  well nourished, well groomed  and in no distress. ? ?Eyes: PERRLA, EOMs, conjunctiva no swelling or erythema. ?Sinuses: No frontal/maxillary tenderness ?ENT/Mouth: EAC's clear, TM's nl w/o erythema, bulging. Nares clear w/o erythema, swelling, exudates. Oropharynx clear without erythema or exudates. Oral hygiene is good. Tongue normal, non obstructing. Hearing intact.  ?Neck: Supple. Thyroid not palpable. Car 2+/2+ without bruits, nodes or JVD. ?Chest: Respirations nl with BS clear & equal w/o rales, rhonchi, wheezing or stridor.  ?Cor: Heart sounds normal w/ regular rate and rhythm without sig. murmurs, gallops, clicks or rubs. Peripheral pulses normal and equal  without edema.  ?Abdomen: Soft & bowel sounds normal. Non-tender w/o guarding, rebound, hernias, masses or organomegaly.  ?Lymphatics: Unremarkable.  ?Musculoskeletal: Full ROM all peripheral extremities, joint stability, 5/5 strength and normal gait.  ?Skin: Warm, dry without exposed rashes, lesions or ecchymosis apparent.  ?Neuro: Cranial nerves intact, reflexes equal bilaterally. Sensory-motor testing grossly intact. Tendon reflexes grossly intact.  ?Pysch: Alert & oriented x 3.  Insight and judgement nl & appropriate. No ideations. ? ?Assessment and Plan: ? ?1. Essential hypertension ? ?- Continue medication, monitor blood pressure at  home.  ?- Continue DASH diet.  Reminder to go to the ER if any CP,  ?SOB, nausea, dizziness, severe HA, changes vision/speech. ?  ?- CBC with Differential/Platelet ?- COMPLETE METABOLIC PANEL WITH GFR ?- Magnesium ?- TSH ? ?2. Hyperlipidemia, mixed ? ?- Continue diet/meds, exercise,& lifestyle modifications.  ?- Continue monitor periodic cholesterol/liver & renal functions  ?  ?- Lipid panel ?- TSH ? ?3. Abnormal glucose ? ?- Continue diet, exercise  ?- Lifestyle modifications.  ?- Monitor appropriate labs\ ?  ?- Hemoglobin A1c ?- Insulin, random ? ?4. Vitamin D deficiency ? ?- Continue supplementation ?  ?- VITAMIN D 25 Hydroxy  ? ?5. Medication management ? ?- CBC with Differential/Platelet ?- COMPLETE METABOLIC PANEL WITH GFR ?- Magnesium ?- Lipid panel ?- TSH ?- Hemoglobin A1c ?- Insulin, random ?- VITAMIN D 25 Hydroxy  ? ?      Discussed  regular exercise, BP monitoring, weight control to achieve/maintain BMI less than 25 and discussed med and SE's. Recommended labs to assess /monitor clinical status .  I discussed the assessment and treatment plan with the patient. The patient was provided an opportunity to ask questions and all were answered. The patient agreed with the plan and demonstrated an understanding of the instructions.  I provided over 30 minutes of exam, counseling, chart review and  complex critical decision making. ? ?      The patient was advised to call back or seek an in-person evaluation if the symptoms worsen or if the condition fails to improve as anticipated. ? ? ?Kirtland Bouchard, MD ? ? ?

## 2021-06-25 NOTE — Patient Instructions (Signed)

## 2021-06-26 ENCOUNTER — Ambulatory Visit (INDEPENDENT_AMBULATORY_CARE_PROVIDER_SITE_OTHER): Payer: Medicare Other | Admitting: Internal Medicine

## 2021-06-26 ENCOUNTER — Encounter: Payer: Self-pay | Admitting: Internal Medicine

## 2021-06-26 VITALS — BP 138/70 | HR 73 | Temp 97.9°F | Resp 16 | Ht 62.0 in | Wt 163.2 lb

## 2021-06-26 DIAGNOSIS — Z79899 Other long term (current) drug therapy: Secondary | ICD-10-CM

## 2021-06-26 DIAGNOSIS — E559 Vitamin D deficiency, unspecified: Secondary | ICD-10-CM

## 2021-06-26 DIAGNOSIS — I10 Essential (primary) hypertension: Secondary | ICD-10-CM

## 2021-06-26 DIAGNOSIS — R7309 Other abnormal glucose: Secondary | ICD-10-CM | POA: Diagnosis not present

## 2021-06-26 DIAGNOSIS — I2510 Atherosclerotic heart disease of native coronary artery without angina pectoris: Secondary | ICD-10-CM

## 2021-06-26 DIAGNOSIS — E782 Mixed hyperlipidemia: Secondary | ICD-10-CM

## 2021-06-27 LAB — COMPLETE METABOLIC PANEL WITH GFR
AG Ratio: 2 (calc) (ref 1.0–2.5)
ALT: 15 U/L (ref 6–29)
AST: 18 U/L (ref 10–35)
Albumin: 4.2 g/dL (ref 3.6–5.1)
Alkaline phosphatase (APISO): 76 U/L (ref 37–153)
BUN: 12 mg/dL (ref 7–25)
CO2: 28 mmol/L (ref 20–32)
Calcium: 9.2 mg/dL (ref 8.6–10.4)
Chloride: 106 mmol/L (ref 98–110)
Creat: 0.83 mg/dL (ref 0.60–1.00)
Globulin: 2.1 g/dL (calc) (ref 1.9–3.7)
Glucose, Bld: 98 mg/dL (ref 65–99)
Potassium: 5.4 mmol/L — ABNORMAL HIGH (ref 3.5–5.3)
Sodium: 139 mmol/L (ref 135–146)
Total Bilirubin: 0.7 mg/dL (ref 0.2–1.2)
Total Protein: 6.3 g/dL (ref 6.1–8.1)
eGFR: 72 mL/min/{1.73_m2} (ref >=60)

## 2021-06-27 LAB — CBC WITH DIFFERENTIAL/PLATELET
Absolute Monocytes: 664 cells/uL (ref 200–950)
Basophils Absolute: 49 cells/uL (ref 0–200)
Basophils Relative: 0.9 %
Eosinophils Absolute: 340 cells/uL (ref 15–500)
Eosinophils Relative: 6.3 %
HCT: 39.4 % (ref 35.0–45.0)
Hemoglobin: 13 g/dL (ref 11.7–15.5)
Lymphs Abs: 1755 cells/uL (ref 850–3900)
MCH: 29.5 pg (ref 27.0–33.0)
MCHC: 33 g/dL (ref 32.0–36.0)
MCV: 89.3 fL (ref 80.0–100.0)
MPV: 10.3 fL (ref 7.5–12.5)
Monocytes Relative: 12.3 %
Neutro Abs: 2592 cells/uL (ref 1500–7800)
Neutrophils Relative %: 48 %
Platelets: 230 10*3/uL (ref 140–400)
RBC: 4.41 10*6/uL (ref 3.80–5.10)
RDW: 12.2 % (ref 11.0–15.0)
Total Lymphocyte: 32.5 %
WBC: 5.4 10*3/uL (ref 3.8–10.8)

## 2021-06-27 LAB — HEMOGLOBIN A1C
Hgb A1c MFr Bld: 5.7 % of total Hgb — ABNORMAL HIGH (ref <5.7)
Mean Plasma Glucose: 117 mg/dL
eAG (mmol/L): 6.5 mmol/L

## 2021-06-27 LAB — TSH: TSH: 2.04 mIU/L (ref 0.40–4.50)

## 2021-06-27 LAB — LIPID PANEL
Cholesterol: 148 mg/dL (ref <200)
HDL: 58 mg/dL (ref >=50)
LDL Cholesterol (Calc): 71 mg/dL (calc)
Non-HDL Cholesterol (Calc): 90 mg/dL (calc) (ref <130)
Total CHOL/HDL Ratio: 2.6 (calc) (ref <5.0)
Triglycerides: 111 mg/dL (ref <150)

## 2021-06-27 LAB — INSULIN, RANDOM: Insulin: 7.2 u[IU]/mL

## 2021-06-27 LAB — VITAMIN D 25 HYDROXY (VIT D DEFICIENCY, FRACTURES): Vit D, 25-Hydroxy: 46 ng/mL (ref 30–100)

## 2021-06-27 LAB — MAGNESIUM: Magnesium: 2.1 mg/dL (ref 1.5–2.5)

## 2021-06-27 NOTE — Progress Notes (Signed)
<><><><><><><><><><><><><><><><><><><><><><><><><><><><><><><><><> ?<><><><><><><><><><><><><><><><><><><><><><><><><><><><><><><><><> ?-   Test results slightly outside the reference range are not unusual. ?If there is anything important, I will review this with you,  ?otherwise it is considered normal test values.  ?If you have further questions,  ?please do not hesitate to contact me at the office or via My Chart.  ?<><><><><><><><><><><><><><><><><><><><><><><><><><><><><><><><><> ?<><><><><><><><><><><><><><><><><><><><><><><><><><><><><><><><><> ? ?-  A1c = 5.7%  is borderline elevated 12 week average glucose,  So  ? ?- Avoid Sweets, Candy & White Stuff  ? ?- White Rice, White Potatoes, White Flour  - Breads &  Pasta ?<><><><><><><><><><><><><><><><><><><><><><><><><><><><><><><><><> ?<><><><><><><><><><><><><><><><><><><><><><><><><><><><><><><><><> ? ?-  Total Chol = 148    &   LDL Chol = 71      - Both    Excellent  ? ?- Very low risk for Heart Attack  / Stroke ?<><><><><><><><><><><><><><><><><><><><><><><><><><><><><><><><><> ?<><><><><><><><><><><><><><><><><><><><><><><><><><><><><><><><><> ? ?-  Vitamin D = 46  is Low  - was 90  ? ?- Vitamin D goal is between 70-100.  ? ?- Please make sure that you are taking your Vitamin D as directed.  ? ?- It is very important as a natural anti-inflammatory and helping the  ?immune system protect against viral infections, like the Covid-19  ? ? ?helping hair, skin, and nails, as well as reducing stroke and  ?heart attack risk.  ? ?- It helps your bones and helps with mood. ? ?- It also decreases numerous cancer risks so please  ?take it as directed.  ? ?- Low Vit D is associated with a 200-300% higher risk for  ?CANCER  ? ?and 200-300% higher risk for HEART   ATTACK  &  STROKE.   ? ?- It is also associated with higher death rate at younger ages,  ? ?autoimmune diseases like Rheumatoid arthritis, Lupus,  ?Multiple Sclerosis.    ? ?- Also many other serious conditions,  like depression, Alzheimer's ? ?Dementia, infertility, muscle aches, fatigue, fibromyalgia  ? ?- just to name a few. ?<><><><><><><><><><><><><><><><><><><><><><><><><><><><><><><><><> ?<><><><><><><><><><><><><><><><><><><><><><><><><><><><><><><><><> ? ?-  All Else - CBC - Kidneys - Electrolytes - Liver - Magnesium & Thyroid   ? ?- all  Normal / OK ?<><><><><><><><><><><><><><><><><><><><><><><><><><><><><><><><><> ?<><><><><><><><><><><><><><><><><><><><><><><><><><><><><><><><><> ? ?-  Keep up the [place] Work    ! ? ?<><><><><><><><><><><><><><><><><><><><><><><><><><><><><><><><><> ?<><><><><><><><><><><><><><><><><><><><><><><><><><><><><><><><><> ? ? ? ? ? ? ? ? ? ? ? ? ? ? ? ? ? ? ? ? ? ? ? ? ? ? ? ? ? ? ? ?

## 2021-08-25 ENCOUNTER — Ambulatory Visit (INDEPENDENT_AMBULATORY_CARE_PROVIDER_SITE_OTHER): Payer: Medicare Other

## 2021-08-25 DIAGNOSIS — I442 Atrioventricular block, complete: Secondary | ICD-10-CM | POA: Diagnosis not present

## 2021-08-28 LAB — CUP PACEART REMOTE DEVICE CHECK
Battery Remaining Longevity: 113 mo
Battery Voltage: 3 V
Brady Statistic AP VP Percent: 7.5 %
Brady Statistic AP VS Percent: 0 %
Brady Statistic AS VP Percent: 92.49 %
Brady Statistic AS VS Percent: 0.02 %
Brady Statistic RA Percent Paced: 7.49 %
Brady Statistic RV Percent Paced: 99.98 %
Date Time Interrogation Session: 20230615211130
Implantable Lead Implant Date: 20200102
Implantable Lead Implant Date: 20200102
Implantable Lead Location: 753859
Implantable Lead Location: 753860
Implantable Lead Model: 4076
Implantable Lead Model: 4076
Implantable Pulse Generator Implant Date: 20200102
Lead Channel Impedance Value: 342 Ohm
Lead Channel Impedance Value: 361 Ohm
Lead Channel Impedance Value: 437 Ohm
Lead Channel Impedance Value: 513 Ohm
Lead Channel Pacing Threshold Amplitude: 0.5 V
Lead Channel Pacing Threshold Amplitude: 0.625 V
Lead Channel Pacing Threshold Pulse Width: 0.4 ms
Lead Channel Pacing Threshold Pulse Width: 0.4 ms
Lead Channel Sensing Intrinsic Amplitude: 12 mV
Lead Channel Sensing Intrinsic Amplitude: 12 mV
Lead Channel Sensing Intrinsic Amplitude: 2.875 mV
Lead Channel Sensing Intrinsic Amplitude: 2.875 mV
Lead Channel Setting Pacing Amplitude: 1.5 V
Lead Channel Setting Pacing Amplitude: 2 V
Lead Channel Setting Pacing Pulse Width: 0.4 ms
Lead Channel Setting Sensing Sensitivity: 2 mV

## 2021-09-19 ENCOUNTER — Other Ambulatory Visit: Payer: Self-pay | Admitting: Internal Medicine

## 2021-10-10 NOTE — Progress Notes (Unsigned)
MEDICARE ANNUAL WELLNESS VISIT AND FOLLOW UP  Assessment:   Diagnoses and all orders for this visit:  Encounter for Medicare annual wellness exam Due annually  Colonoscopy is due this year but previously had in [place]- she is getting records faxed to our office  Complete heart block/ S/p pacemaker Now follows with Dr. Rayann Heman Doing well s/p pacemaker Monitor   Arteriosclerotic heart disease S/p stent 05/2018; completed DAPT x 1 year, now on bASA Denies CP Follows with cardiology Control blood pressure, cholesterol, glucose, encourage lifestyle/exercise   Hypertension Well controlled with current medications Monitor blood pressure at home; patient to call if consistently greater than 130/80 Continue DASH diet.   Reminder to go to the ER if any CP, SOB, nausea, dizziness, severe HA, changes vision/speech, left arm numbness and tingling and jaw pain.  Cholesterol Continue statin therapy for LDL, Rosuvastatin 10 mg  LDL goal of <70 reviewed Continue low cholesterol diet and exercise.  Check lipid panel, CBC, CMP  Other abnormal glucse Recent A1Cs at goal Discussed diet/exercise, weight management  Defer A1C; check CMP for serum glucose, monitor weight trends  Overweight - BMI 29 Long discussion about weight loss, diet, and exercise Recommended diet heavy in fruits and veggies and low in animal meats, cheeses, and dairy products, appropriate calorie intake Patient will work on getting more activity Has lost 9 pounds over the past year Will follow up in 3 months  Vitamin D Def continue to recommend supplementation to maintain goal of 60-100 - Vit D  History of skin cancer Follows regularly with dermatology   Estrogen Def - DEXA UTD 09/30/20 osteopenia  Orthostatic lightheadedness BP'S have been running on the lower side of 110's/60's at home Has appointment with Dr. Rayann Heman tomorrow- advised to inform him of episode and take her BP readings with her Continue to  monitor and if happens more frequently and no med adjustments are made at cardiology notify the office  Over 40 minutes of exam, counseling, chart review and critical decision making was performed Future Appointments  Date Time Provider [place]  10/12/2021  2:00 PM Thompson Grayer, MD [facility]  11/24/2021  7:00 AM [facility] DEVICE REMOTES [facility]  01/23/2022 10:00 AM Unk Maddison, MD [facility] None  02/23/2022  7:00 AM [facility] DEVICE REMOTES [facility]  05/25/2022  7:00 AM [facility] DEVICE REMOTES [facility]  08/24/2022  7:00 AM [facility] DEVICE REMOTES [facility]  10/12/2022 11:00 AM Alycia Rossetti, NP [facility] None  11/23/2022  7:00 AM [facility] DEVICE REMOTES [facility]     Plan:   During the course of the visit the patient was educated and counseled about appropriate screening and preventive services including:   Pneumococcal vaccine  Prevnar 13 Influenza vaccine Td vaccine Screening electrocardiogram Bone densitometry screening Colorectal cancer screening Diabetes screening Glaucoma screening Nutrition counseling  Advanced directives: requested   Subjective:  Kristy [name] is a 76 y.o. female who presents for Medicare Annual Wellness Visit and 3 month follow up.    She had an episode of lightheaded which occurred 5 days ago.  Took her BP and was 122/71, 118/65, 112/64.   She is following with [facility] Dr. Martin Majestic due to history of skin cancer, had moh's of left temple remotely. No recent concerns.   BMI is Body mass index is 29.34 kg/m., she has been working on diet, more smoothies. Minimal exercise.  Wt Readings from Last 3 Encounters:  10/11/21 160 lb 6.4 oz (72.8 kg)  06/26/21  163 lb 3.2 oz (74 kg)  03/28/21 163 lb (73.9 kg)   Hx of complete heart block in 03/2018 s/p pacemaker and continues to do well; She is now established with Dr. Rayann Heman. She  has appointment with Dr. Rayann Heman tomorrow  She had a (+) Nuclear stress test early 2020 and underwent cath and had a stent implanted (Mar 2020) and has done well since, completed 1 year of DAPT, now on bASA only.   She has had elevated blood pressure since the 90s. Her blood pressure has been controlled at home, today their BP is BP: 138/76  BP Readings from Last 3 Encounters:  10/11/21 138/76  06/26/21 138/70  03/28/21 112/64  She does not workout. She denies chest pain, shortness of breath, dizziness.    She is on cholesterol medication (rosuvastatin 20 mg daily) and denies myalgias. Her cholesterol is at goal. The cholesterol last visit was:   Lab Results  Component Value Date   CHOL 148 06/26/2021   HDL 58 06/26/2021   LDLCALC 71 06/26/2021   TRIG 111 06/26/2021   CHOLHDL 2.6 06/26/2021    She has not been working on diet and exercise for glucose management. Last A1C in the office was:  Lab Results  Component Value Date   HGBA1C 5.7 (H) 06/26/2021   Last GFR: Lab Results  Component Value Date   EGFR 72 06/26/2021    Patient is on Vitamin D supplement.   Lab Results  Component Value Date   VD25OH 46 06/26/2021      Cataracts were done bilaterally in the past year, no complications, 03/6604  L eye and 03/2021 R EYE  Medication Review: Current Outpatient Medications on File Prior to Visit  Medication Sig Dispense Refill   amLODipine (NORVASC) 5 MG tablet TAKE 1 TABLET BY MOUTH  DAILY 90 tablet 3   Ascorbic Acid (VITAMIN C PO) Take 1,000 mg by mouth daily.     aspirin EC 81 MG tablet Take 81 mg by mouth daily.     CHOLECALCIFEROL PO Take 10,000 Units by mouth daily.     metoprolol succinate (TOPROL-XL) 25 MG 24 hr tablet Take 1 tablet (25 mg total) by mouth daily. 90 tablet 3   rosuvastatin (CRESTOR) 20 MG tablet TAKE 1 TABLET BY MOUTH  DAILY 90 tablet 3   zinc gluconate 50 MG tablet Take 50 mg by mouth daily.     No current facility-administered medications on file  prior to visit.    Allergies  Allergen Reactions   Penicillins     Current Problems (verified) Patient Active Problem List   Diagnosis Date Noted   Osteopenia 10/03/2020   Obesity (BMI 30.0-34.9) 09/01/2019   History of skin cancer 02/23/2019   Arteriosclerotic heart disease (ASHD) 11/18/2018   Cardiac pacemaker 11/18/2018   Complete heart block ([place]) 11/18/2018   Vitamin D deficiency 11/18/2018   Abnormal glucose 11/18/2018   Essential hypertension 11/18/2018   Hyperlipidemia 2010    Screening Tests Immunization History  Administered Date(s) Administered   Fluad Quad(high Dose 65+) 12/30/2018   Influenza, High Dose Seasonal PF 01/12/2020, 12/26/2020   Influenza,inj,quad, With Preservative 01/14/2017   PFIZER(Purple Top)SARS-COV-2 Vaccination 04/17/2019, 05/12/2019, 12/31/2019   Pneumococcal Conjugate-13 04/12/2018   Pneumococcal Polysaccharide-23 09/02/2019   Zoster Recombinat (Shingrix) 02/09/2018, 04/29/2018    Health Maintenance  Topic Date Due   TETANUS/TDAP  Never done   COVID-19 Vaccine (4 - Booster for Pfizer series) 02/25/2020   INFLUENZA VACCINE  10/10/2021   Pneumonia Vaccine  62+ Years old  Completed   Hepatitis C Screening  Completed   Zoster Vaccines- Shingrix  Completed   HPV VACCINES  Aged Out   DEXA SCAN  Discontinued  Mammogram 02/21/21- negative DEXA 09/30/20 - osteopenia  Names of Other Physician/Practitioners you currently use: 1. [facility] here for primary care 2. Dr. Katy Fitch, eye doctor, last visit 06/2021, monitoring cataract, possible surgery this year 3. Dr. Deanna Artis, dentist, last visit 08/2021  Patient Care Team: Unk Boucher, MD as PCP - General (Internal Medicine) Ulla Gallo, MD as Consulting Physician (Dermatology)  SURGICAL HISTORY She  has a past surgical history that includes Cholecystectomy (2011); Rotator cuff repair (Right, 2016); mohl (2009); PACEMAKER IMPLANT (03/2018); and  Coronary angioplasty with stent (05/2018). FAMILY HISTORY Her family history includes Colon cancer in her mother; Diabetes in her father; Hypertension in her brother. SOCIAL HISTORY She  reports that she has never smoked. She has never used smokeless tobacco. She reports that she does not use drugs.   MEDICARE WELLNESS OBJECTIVES: Physical activity: Current Exercise Habits: The patient does not participate in regular exercise at present, Exercise limited by: cardiac condition(s) Cardiac risk factors: Cardiac Risk Factors include: advanced age (>36mn, >>22women);dyslipidemia;hypertension;sedentary lifestyle Depression/mood screen:      10/11/2021   10:45 AM  Depression screen PHQ 2/9  Decreased Interest 0  Down, Depressed, Hopeless 0  PHQ - 2 Score 0    ADLs:     10/11/2021   10:47 AM 06/26/2021    8:24 PM  In your present state of health, do you have any difficulty performing the following activities:  Hearing? 0 0  Vision? 0 0  Difficulty concentrating or making decisions? 0 0  Walking or climbing stairs? 0 0  Dressing or bathing? 0 0  Doing errands, shopping? 0 0     Cognitive Testing  Alert? Yes  Normal Appearance?Yes  Oriented to person? Yes  Place? Yes   Time? Yes  Recall of three objects?  Yes  Can perform simple calculations? Yes  Displays appropriate judgment?Yes  Can read the correct time from a watch face?Yes  EOL planning: Does Patient Have a Medical Advance Directive?: No, Yes Type of Advance Directive: Healthcare Power of Attorney, Living will Does patient want to make changes to medical advance directive?: No - Patient declined [place] No - copy requested  Review of Systems  Constitutional:  Negative for malaise/fatigue and weight loss.  HENT:  Negative for hearing loss and tinnitus.   Eyes:  Negative for blurred vision and double vision.  Respiratory:  Negative for cough, sputum production, shortness of breath and  wheezing.   Cardiovascular:  Negative for chest pain, palpitations, orthopnea, claudication, leg swelling and PND.  Gastrointestinal:  Negative for abdominal pain, blood in stool, constipation, diarrhea, heartburn, melena, nausea and vomiting.  Genitourinary: Negative.   Musculoskeletal:  Negative for falls, joint pain and myalgias.  Skin:  Negative for rash.  Neurological:  Negative for dizziness, tingling, sensory change, weakness and headaches.       1 episode of lightheadedness  Endo/Heme/Allergies:  Negative for polydipsia.  Psychiatric/Behavioral: Negative.  Negative for depression, memory loss, substance abuse and suicidal ideas. The patient is not nervous/anxious and does not have insomnia.   All other systems reviewed and are negative.    Objective:     Today's Vitals   10/11/21 1022  BP: 138/76  Pulse: 83  Temp: (!) 97.5 F (36.4  C)  SpO2: 99%  Weight: 160 lb 6.4 oz (72.8 kg)  Height: '5\' 2"'  (1.575 m)    Body mass index is 29.34 kg/m.  General appearance: alert, no distress, WD/WN, female HEENT: normocephalic, sclerae anicteric, TMs pearly, nares patent, no discharge or erythema, pharynx normal Oral cavity: MMM, no lesions Neck: supple, no lymphadenopathy, no thyromegaly, no masses Heart: RRR, normal S1, S2, no murmurs Lungs: CTA bilaterally, no wheezes, rhonchi, or rales Abdomen: +bs, soft, non tender, non distended, no masses, no hepatomegaly, no splenomegaly Musculoskeletal: nontender, no swelling, no obvious deformity Extremities: no edema, no cyanosis, no clubbing Pulses: 2+ symmetric, upper and lower extremities, normal cap refill Neurological: alert, oriented x 3, CN2-12 intact, strength normal upper extremities and lower extremities, sensation normal throughout, DTRs 2+ throughout, no cerebellar signs, gait normal Psychiatric: normal affect, behavior normal, pleasant   Medicare Attestation I have personally reviewed: The patient's medical and social  history Their use of alcohol, tobacco or illicit drugs Their current medications and supplements The patient's functional ability including ADLs,fall risks, home safety risks, cognitive, and hearing and visual impairment Diet and physical activities Evidence for depression or mood disorders  The patient's weight, height, BMI, and visual acuity have been recorded in the chart.  I have made referrals, counseling, and provided education to the patient based on review of the above and I have provided the patient with a written personalized care plan for preventive services.     Alycia Rossetti, NP   10/11/2021

## 2021-10-11 ENCOUNTER — Ambulatory Visit (INDEPENDENT_AMBULATORY_CARE_PROVIDER_SITE_OTHER): Payer: Medicare Other | Admitting: Nurse Practitioner

## 2021-10-11 ENCOUNTER — Encounter: Payer: Self-pay | Admitting: Nurse Practitioner

## 2021-10-11 VITALS — BP 138/76 | HR 83 | Temp 97.5°F | Ht 62.0 in | Wt 160.4 lb

## 2021-10-11 DIAGNOSIS — E66811 Obesity, class 1: Secondary | ICD-10-CM

## 2021-10-11 DIAGNOSIS — R7309 Other abnormal glucose: Secondary | ICD-10-CM | POA: Diagnosis not present

## 2021-10-11 DIAGNOSIS — R6889 Other general symptoms and signs: Secondary | ICD-10-CM

## 2021-10-11 DIAGNOSIS — I10 Essential (primary) hypertension: Secondary | ICD-10-CM

## 2021-10-11 DIAGNOSIS — Z0000 Encounter for general adult medical examination without abnormal findings: Secondary | ICD-10-CM

## 2021-10-11 DIAGNOSIS — Z85828 Personal history of other malignant neoplasm of skin: Secondary | ICD-10-CM

## 2021-10-11 DIAGNOSIS — Z0001 Encounter for general adult medical examination with abnormal findings: Secondary | ICD-10-CM

## 2021-10-11 DIAGNOSIS — I2510 Atherosclerotic heart disease of native coronary artery without angina pectoris: Secondary | ICD-10-CM | POA: Diagnosis not present

## 2021-10-11 DIAGNOSIS — I442 Atrioventricular block, complete: Secondary | ICD-10-CM

## 2021-10-11 DIAGNOSIS — R42 Dizziness and giddiness: Secondary | ICD-10-CM

## 2021-10-11 DIAGNOSIS — Z79899 Other long term (current) drug therapy: Secondary | ICD-10-CM

## 2021-10-11 DIAGNOSIS — E669 Obesity, unspecified: Secondary | ICD-10-CM

## 2021-10-11 DIAGNOSIS — E559 Vitamin D deficiency, unspecified: Secondary | ICD-10-CM

## 2021-10-11 DIAGNOSIS — E2839 Other primary ovarian failure: Secondary | ICD-10-CM

## 2021-10-11 DIAGNOSIS — E782 Mixed hyperlipidemia: Secondary | ICD-10-CM

## 2021-10-11 NOTE — Patient Instructions (Signed)

## 2021-10-12 ENCOUNTER — Encounter (HOSPITAL_BASED_OUTPATIENT_CLINIC_OR_DEPARTMENT_OTHER): Payer: Self-pay | Admitting: Internal Medicine

## 2021-10-12 ENCOUNTER — Ambulatory Visit (INDEPENDENT_AMBULATORY_CARE_PROVIDER_SITE_OTHER): Payer: Medicare Other | Admitting: Internal Medicine

## 2021-10-12 VITALS — BP 144/60 | HR 90 | Ht 62.0 in | Wt 159.0 lb

## 2021-10-12 DIAGNOSIS — I442 Atrioventricular block, complete: Secondary | ICD-10-CM | POA: Diagnosis not present

## 2021-10-12 DIAGNOSIS — Z950 Presence of cardiac pacemaker: Secondary | ICD-10-CM

## 2021-10-12 DIAGNOSIS — I2510 Atherosclerotic heart disease of native coronary artery without angina pectoris: Secondary | ICD-10-CM | POA: Diagnosis not present

## 2021-10-12 LAB — COMPLETE METABOLIC PANEL WITH GFR
AG Ratio: 2 (calc) (ref 1.0–2.5)
ALT: 16 U/L (ref 6–29)
AST: 19 U/L (ref 10–35)
Albumin: 4.3 g/dL (ref 3.6–5.1)
Alkaline phosphatase (APISO): 79 U/L (ref 37–153)
BUN: 11 mg/dL (ref 7–25)
CO2: 27 mmol/L (ref 20–32)
Calcium: 9.3 mg/dL (ref 8.6–10.4)
Chloride: 104 mmol/L (ref 98–110)
Creat: 0.78 mg/dL (ref 0.60–1.00)
Globulin: 2.1 g/dL (calc) (ref 1.9–3.7)
Glucose, Bld: 98 mg/dL (ref 65–99)
Potassium: 4.8 mmol/L (ref 3.5–5.3)
Sodium: 139 mmol/L (ref 135–146)
Total Bilirubin: 0.7 mg/dL (ref 0.2–1.2)
Total Protein: 6.4 g/dL (ref 6.1–8.1)
eGFR: 78 mL/min/{1.73_m2} (ref >=60)

## 2021-10-12 LAB — CUP PACEART INCLINIC DEVICE CHECK
Battery Remaining Longevity: 113 mo
Battery Voltage: 3 V
Brady Statistic AP VP Percent: 6.92 %
Brady Statistic AP VS Percent: 0 %
Brady Statistic AS VP Percent: 93.06 %
Brady Statistic AS VS Percent: 0.02 %
Brady Statistic RA Percent Paced: 6.92 %
Brady Statistic RV Percent Paced: 99.98 %
Date Time Interrogation Session: 20230803145440
Implantable Lead Implant Date: 20200102
Implantable Lead Implant Date: 20200102
Implantable Lead Location: 753859
Implantable Lead Location: 753860
Implantable Lead Model: 4076
Implantable Lead Model: 4076
Implantable Pulse Generator Implant Date: 20200102
Lead Channel Impedance Value: 418 Ohm
Lead Channel Impedance Value: 437 Ohm
Lead Channel Impedance Value: 494 Ohm
Lead Channel Impedance Value: 551 Ohm
Lead Channel Pacing Threshold Amplitude: 0.5 V
Lead Channel Pacing Threshold Amplitude: 0.625 V
Lead Channel Pacing Threshold Pulse Width: 0.4 ms
Lead Channel Pacing Threshold Pulse Width: 0.4 ms
Lead Channel Sensing Intrinsic Amplitude: 12 mV
Lead Channel Sensing Intrinsic Amplitude: 12 mV
Lead Channel Sensing Intrinsic Amplitude: 3.125 mV
Lead Channel Sensing Intrinsic Amplitude: 3.375 mV
Lead Channel Setting Pacing Amplitude: 1.5 V
Lead Channel Setting Pacing Amplitude: 2 V
Lead Channel Setting Pacing Pulse Width: 0.4 ms
Lead Channel Setting Sensing Sensitivity: 2 mV

## 2021-10-12 LAB — CBC WITH DIFFERENTIAL/PLATELET
Absolute Monocytes: 535 cells/uL (ref 200–950)
Basophils Absolute: 50 cells/uL (ref 0–200)
Basophils Relative: 1 %
Eosinophils Absolute: 270 cells/uL (ref 15–500)
Eosinophils Relative: 5.4 %
HCT: 40.4 % (ref 35.0–45.0)
Hemoglobin: 13.1 g/dL (ref 11.7–15.5)
Lymphs Abs: 1570 cells/uL (ref 850–3900)
MCH: 29.4 pg (ref 27.0–33.0)
MCHC: 32.4 g/dL (ref 32.0–36.0)
MCV: 90.6 fL (ref 80.0–100.0)
MPV: 10.6 fL (ref 7.5–12.5)
Monocytes Relative: 10.7 %
Neutro Abs: 2575 cells/uL (ref 1500–7800)
Neutrophils Relative %: 51.5 %
Platelets: 252 10*3/uL (ref 140–400)
RBC: 4.46 10*6/uL (ref 3.80–5.10)
RDW: 12.2 % (ref 11.0–15.0)
Total Lymphocyte: 31.4 %
WBC: 5 10*3/uL (ref 3.8–10.8)

## 2021-10-12 LAB — VITAMIN D 25 HYDROXY (VIT D DEFICIENCY, FRACTURES): Vit D, 25-Hydroxy: 54 ng/mL (ref 30–100)

## 2021-10-12 LAB — LIPID PANEL
Cholesterol: 124 mg/dL (ref <200)
HDL: 49 mg/dL — ABNORMAL LOW (ref >=50)
LDL Cholesterol (Calc): 56 mg/dL (calc)
Non-HDL Cholesterol (Calc): 75 mg/dL (calc) (ref <130)
Total CHOL/HDL Ratio: 2.5 (calc) (ref <5.0)
Triglycerides: 105 mg/dL (ref <150)

## 2021-10-12 NOTE — Patient Instructions (Addendum)
Medication Instructions:  Your physician recommends that you continue on your current medications as directed. Please refer to the Current Medication list given to you today. *If you need a refill on your cardiac medications before your next appointment, please call your pharmacy*  Lab Work: None ordered. If you have labs (blood work) drawn today and your tests are completely normal, you will receive your results only by: [place] (if you have MyChart) OR A paper copy in the mail If you have any lab test that is abnormal or we need to change your treatment, we will call you to review the results.  Testing/Procedures: None ordered.  Follow-Up: At [facility], you and your health needs are our priority.  As part of our continuing mission to provide you with exceptional heart care, we have created designated Provider Care Teams.  These Care Teams include your primary Cardiologist (physician) and Advanced Practice Providers (APPs -  Physician Assistants and Nurse Practitioners) who all work together to provide you with the care you need, when you need it.  Your next appointment:    Your physician wants you to follow-up in: one year follow up with provider Tommye Standard, PA-C.  You will receive a reminder letter in the mail two months in advance. If you don't receive a letter, please call our office to schedule the follow-up appointment.  [facility] TELEPHONE NUMBER [phone]  Important Information About Sugar

## 2021-10-12 NOTE — Progress Notes (Signed)
     PCP: Unk Gloor, MD   Primary EP:  Dr Mora Appl Kristy [name] is a 76 y.o. female who presents today for routine electrophysiology followup.  Since last being seen in our clinic, the patient reports doing very well.  Today, she denies symptoms of palpitations, chest pain, shortness of breath,  lower extremity edema,  presyncope, or syncope.  She did have dizziness on Friday which has resolved. The patient is otherwise without complaint today.   Past Medical History:  Diagnosis Date   Abnormal glucose 11/18/2018   Allergy    penicillin   Arteriosclerotic heart disease (ASHD) 11/18/2018   s/p PCI 05/2018   Cardiac pacemaker 11/18/2018   Complete heart block ([place]) 11/18/2018   Essential hypertension 11/18/2018   Hyperlipidemia 2010   Hypertension 2000   Osteoporosis    Past Surgical History:  Procedure Laterality Date   CHOLECYSTECTOMY  2011   CORONARY ANGIOPLASTY WITH STENT PLACEMENT  05/2018   mohl  2009   face   PACEMAKER IMPLANT  03/2018   MDT pacemaker implanted in [place] Right 2016    ROS- all systems are reviewed and negative except as per HPI above  Current Outpatient Medications  Medication Sig Dispense Refill   amLODipine (NORVASC) 5 MG tablet TAKE 1 TABLET BY MOUTH  DAILY 90 tablet 3   Ascorbic Acid (VITAMIN C PO) Take 1,000 mg by mouth daily.     aspirin EC 81 MG tablet Take 81 mg by mouth daily.     CHOLECALCIFEROL PO Take 10,000 Units by mouth daily.     metoprolol succinate (TOPROL-XL) 25 MG 24 hr tablet Take 1 tablet (25 mg total) by mouth daily. 90 tablet 3   rosuvastatin (CRESTOR) 20 MG tablet TAKE 1 TABLET BY MOUTH  DAILY 90 tablet 3   zinc gluconate 50 MG tablet Take 50 mg by mouth daily.     No current facility-administered medications for this visit.    Physical Exam: Vitals:   10/12/21 1356  BP: (!) 144/60  Pulse: 90  Weight: 159 lb (72.1 kg)  Height: '5\' 2"'$  (1.575 m)    GEN- The patient is well appearing, alert and  oriented x 3 today.   Head- normocephalic, atraumatic Eyes-  Sclera clear, conjunctiva pink Ears- hearing intact Oropharynx- clear Lungs- Clear to ausculation bilaterally, normal work of breathing Chest- pacemaker pocket is well healed Heart- Regular rate and rhythm, no murmurs, rubs or gallops, PMI not laterally displaced GI- soft, NT, ND, + BS Extremities- no clubbing, cyanosis, or edema  Pacemaker interrogation- reviewed in detail today,  See PACEART report  ekg tracing ordered today is personally reviewed and shows AV paced  Assessment and Plan:  1. Symptomatic complete heart block Normal pacemaker function See Pace Art report No changes today she is device dependant today  2. CAD No ischemic symptoms Stable No change required today  3. HL Stable No change required today  4. HTN Stable No change required today  Return to see EP APP in a year  Thompson Grayer MD, [facility] 10/12/2021 2:39 PM

## 2021-11-23 LAB — CUP PACEART REMOTE DEVICE CHECK
Battery Remaining Longevity: 108 mo
Battery Voltage: 3 V
Brady Statistic AP VP Percent: 6.87 %
Brady Statistic AP VS Percent: 0 %
Brady Statistic AS VP Percent: 93.11 %
Brady Statistic AS VS Percent: 0.02 %
Brady Statistic RA Percent Paced: 6.85 %
Brady Statistic RV Percent Paced: 99.98 %
Date Time Interrogation Session: 20230914024640
Implantable Lead Implant Date: 20200102
Implantable Lead Implant Date: 20200102
Implantable Lead Location: 753859
Implantable Lead Location: 753860
Implantable Lead Model: 4076
Implantable Lead Model: 4076
Implantable Pulse Generator Implant Date: 20200102
Lead Channel Impedance Value: 323 Ohm
Lead Channel Impedance Value: 342 Ohm
Lead Channel Impedance Value: 399 Ohm
Lead Channel Impedance Value: 475 Ohm
Lead Channel Pacing Threshold Amplitude: 0.5 V
Lead Channel Pacing Threshold Amplitude: 0.625 V
Lead Channel Pacing Threshold Pulse Width: 0.4 ms
Lead Channel Pacing Threshold Pulse Width: 0.4 ms
Lead Channel Sensing Intrinsic Amplitude: 12 mV
Lead Channel Sensing Intrinsic Amplitude: 12 mV
Lead Channel Sensing Intrinsic Amplitude: 2.875 mV
Lead Channel Sensing Intrinsic Amplitude: 2.875 mV
Lead Channel Setting Pacing Amplitude: 1.5 V
Lead Channel Setting Pacing Amplitude: 2 V
Lead Channel Setting Pacing Pulse Width: 0.4 ms
Lead Channel Setting Sensing Sensitivity: 2 mV

## 2021-11-24 ENCOUNTER — Ambulatory Visit (INDEPENDENT_AMBULATORY_CARE_PROVIDER_SITE_OTHER): Payer: Medicare Other

## 2021-11-24 DIAGNOSIS — I442 Atrioventricular block, complete: Secondary | ICD-10-CM

## 2021-12-06 NOTE — Progress Notes (Signed)
Remote pacemaker transmission.   

## 2021-12-09 ENCOUNTER — Other Ambulatory Visit: Payer: Self-pay | Admitting: Nurse Practitioner

## 2021-12-26 ENCOUNTER — Encounter: Payer: Medicare Other | Admitting: Internal Medicine

## 2022-01-03 ENCOUNTER — Other Ambulatory Visit: Payer: Self-pay | Admitting: Nurse Practitioner

## 2022-01-03 DIAGNOSIS — I10 Essential (primary) hypertension: Secondary | ICD-10-CM

## 2022-01-18 ENCOUNTER — Other Ambulatory Visit: Payer: Self-pay | Admitting: Internal Medicine

## 2022-01-18 DIAGNOSIS — Z1231 Encounter for screening mammogram for malignant neoplasm of breast: Secondary | ICD-10-CM

## 2022-01-22 ENCOUNTER — Encounter: Payer: Self-pay | Admitting: Internal Medicine

## 2022-01-22 NOTE — Patient Instructions (Signed)
Due to recent changes in healthcare laws, you Dudash see the results of your imaging and laboratory studies on MyChart before your provider has had a chance to review them.  We understand that in some cases there Gin be results that are confusing or concerning to you. Not all laboratory results come back in the same time frame and the provider Hoback be waiting for multiple results in order to interpret others.  Please give [place] 48 hours in order for your provider to thoroughly review all the results before contacting the office for clarification of your results.   +++++++++++++++++++++++++  Vit D  & Vit C 1,000 mg   are recommended to help protect  against the Covid-19 and other Corona viruses.    Also it's recommended  to take  Zinc 50 mg  to help  protect against the Covid-19   and best place to get  is also on [email or web address]  and don't pay more than 6-8 cents /pill !  ================================ Coronavirus (COVID-19) Are you at risk?  Are you at risk for the Coronavirus (COVID-19)?  To be considered HIGH RISK for Coronavirus (COVID-19), you have to meet the following criteria:  Traveled to [place], [place], [place], [place] or [place]; or in the [place] to [place], [place], [place]  or [place]; and have fever, cough, and shortness of breath within the last 2 weeks of travel OR Been in close contact with a person diagnosed with COVID-19 within the last 2 weeks and have  fever, cough,and shortness of breath  IF YOU DO NOT MEET THESE CRITERIA, YOU ARE CONSIDERED LOW RISK FOR COVID-19.  What to do if you are HIGH RISK for COVID-19?  If you are having a medical emergency, call 911. Seek medical care right away. Before you go to a doctor's office, urgent care or emergency department,  call ahead and tell them about your recent travel, contact with someone diagnosed with COVID-19   and your symptoms.  You should receive instructions from your physician's office regarding next  steps of care.  When you arrive at healthcare provider, tell the healthcare staff immediately you have returned from  visiting [place], [place], [place], [place] or [place]; or traveled in the [place] to [place], [place],  [place] or [place] in the last two weeks or you have been in close contact with a person diagnosed with  COVID-19 in the last 2 weeks.   Tell the health care staff about your symptoms: fever, cough and shortness of breath. After you have been seen by a medical provider, you will be either: Tested for (COVID-19) and discharged home on quarantine except to seek medical care if  symptoms worsen, and asked to  Stay home and avoid contact with others until you get your results (4-5 days)  Avoid travel on public transportation if possible (such as bus, train, or airplane) or Sent to the Emergency Department by EMS for evaluation, COVID-19 testing  and  possible admission depending on your condition and test results.  What to do if you are LOW RISK for COVID-19?  Reduce your risk of any infection by using the same precautions used for avoiding the common cold or flu:  Wash your hands often with soap and warm water for at least 20 seconds.  If soap and water are not readily available,  use an alcohol-based hand sanitizer with at least 60% alcohol.  If coughing or sneezing, cover your mouth and nose by coughing  or sneezing into the elbow areas of your shirt or coat,  into a tissue or into your sleeve (not your hands). Avoid shaking hands with others and consider head nods or verbal greetings only. Avoid touching your eyes, nose, or mouth with unwashed hands.  Avoid close contact with people who are sick. Avoid places or events with large numbers of people in one location, like concerts or sporting events. Carefully consider travel plans you have or are making. If you are planning any travel outside or inside the [place], visit the CDC's Travelers' Health webpage for the  latest health notices. If you have some symptoms but not all symptoms, continue to monitor at home and seek medical attention  if your symptoms worsen. If you are having a medical emergency, call 911.   >>>>>>>>>>>>>>>>>>>>>>>>>>>>>>>>>  We Do NOT Approve of [employer] > > > > > > > > > > > > > > > > > > > > > > > > > > > > > > > > > > > > > > >  Preventive Care for Adults  A healthy lifestyle and preventive care can promote health and wellness. Preventive health guidelines for women include the following key practices. A routine yearly physical is a good way to check with your health care provider about your health and preventive screening. It is a chance to share any concerns and updates on your health and to receive a thorough exam. Visit your dentist for a routine exam and preventive care every 6 months. Brush your teeth twice a day and floss once a day. Good oral hygiene prevents tooth decay and gum disease. The frequency of eye exams is based on your age, health, family medical history, use of contact lenses, and other factors. Follow your health care provider's recommendations for frequency of eye exams. Eat a healthy diet. Foods like vegetables, fruits, whole grains, low-fat dairy products, and lean protein foods contain the nutrients you need without too many calories. Decrease your intake of foods high in solid fats, added sugars, and salt. Eat the right amount of calories for you. Get information about a proper diet from your health care provider, if necessary. Regular physical exercise is one of the most important things you can do for your health. Most adults should get at least 150 minutes of moderate-intensity exercise (any activity that increases your heart rate and causes you to sweat) each week. In addition, most adults need muscle-strengthening exercises on 2 or more days a week. Maintain a healthy weight. The body mass index (BMI) is a screening tool to identify  possible weight problems. It provides an estimate of body fat based on height and weight. Your health care provider can find your BMI and can help you achieve or maintain a healthy weight. For adults 20 years and older: A BMI below 18.5 is considered underweight. A BMI of 18.5 to 24.9 is normal. A BMI of 25 to 29.9 is considered overweight. A BMI of 30 and above is considered obese. Maintain normal blood lipids and cholesterol levels by exercising and minimizing your intake of saturated fat. Eat a balanced diet with plenty of fruit and vegetables. If your lipid or cholesterol levels are high, you are over 50, or you are at high risk for heart disease, you may need your cholesterol levels checked more frequently. Ongoing high lipid and cholesterol levels should be treated with medicines if diet and exercise are not working. If you smoke, find out from  your health care provider how to quit. If you do not use tobacco, do not start. Lung cancer screening is recommended for adults aged 55-80 years who are at high risk for developing lung cancer because of a history of smoking. A yearly low-dose CT scan of the lungs is recommended for people who have at least a 30-pack-year history of smoking and are a current smoker or have quit within the past 15 years. A pack year of smoking is smoking an average of 1 pack of cigarettes a day for 1 year (for example: 1 pack a day for 30 years or 2 packs a day for 15 years). Yearly screening should continue until the smoker has stopped smoking for at least 15 years. Yearly screening should be stopped for people who develop a health problem that would prevent them from having lung cancer treatment. Avoid use of street drugs. Do not share needles with anyone. Ask for help if you need support or instructions about stopping the use of drugs. High blood pressure causes heart disease and increases the risk of stroke.  Ongoing high blood pressure should be treated with medicines if  weight loss and exercise do not work. If you are 55-79 years old, ask your health care provider if you should take aspirin to prevent strokes. Diabetes screening involves taking a blood sample to check your fasting blood sugar level. This should be done once every 3 years, after age 45, if you are within normal weight and without risk factors for diabetes. Testing should be considered at a younger age or be carried out more frequently if you are overweight and have at least 1 risk factor for diabetes. Breast cancer screening is essential preventive care for women. You should practice "breast self-awareness." This means understanding the normal appearance and feel of your breasts and may include breast self-examination. Any changes detected, no matter how small, should be reported to a health care provider. Women in their 20s and 30s should have a clinical breast exam (CBE) by a health care provider as part of a regular health exam every 1 to 3 years. After age 40, women should have a CBE every year. Starting at age 40, women should consider having a mammogram (breast X-ray test) every year. Women who have a family history of breast cancer should talk to their health care provider about genetic screening. Women at a high risk of breast cancer should talk to their health care providers about having an MRI and a mammogram every year. Breast cancer gene (BRCA)-related cancer risk assessment is recommended for women who have family members with BRCA-related cancers. BRCA-related cancers include breast, ovarian, tubal, and peritoneal cancers. Having family members with these cancers may be associated with an increased risk for harmful changes (mutations) in the breast cancer genes BRCA1 and BRCA2. Results of the assessment will determine the need for genetic counseling and BRCA1 and BRCA2 testing. Routine pelvic exams to screen for cancer are no longer recommended for nonpregnant women who are considered low risk for  cancer of the pelvic organs (ovaries, uterus, and vagina) and who do not have symptoms. Ask your health care provider if a screening pelvic exam is right for you. If you have had past treatment for cervical cancer or a condition that could lead to cancer, you need Pap tests and screening for cancer for at least 20 years after your treatment. If Pap tests have been discontinued, your risk factors (such as having a new sexual partner) need to be   reassessed to determine if screening should be resumed. Some women have medical problems that increase the chance of getting cervical cancer. In these cases, your health care provider may recommend more frequent screening and Pap tests.  Colorectal cancer can be detected and often prevented. Most routine colorectal cancer screening begins at the age of 9 years and continues through age 47 years. However, your health care provider may recommend screening at an earlier age if you have risk factors for colon cancer. On a yearly basis, your health care provider may provide home test kits to check for hidden blood in the stool. Use of a small camera at the end of a tube, to directly examine the colon (sigmoidoscopy or colonoscopy), can detect the earliest forms of colorectal cancer. Talk to your health care provider about this at age 66, when routine screening begins.  Direct exam of the colon should be repeated every 5-10 years through age 98 years, unless early forms of pre-cancerous polyps or small growths are found. Osteoporosis is a disease in which the bones lose minerals and strength with aging. This can result in serious bone fractures or breaks. The risk of osteoporosis can be identified using a bone density scan. Women ages 48 years and over and women at risk for fractures or osteoporosis should discuss screening with their health care providers. Ask your health care provider whether you should take a calcium supplement or vitamin D to reduce the rate of  osteoporosis. Menopause can be associated with physical symptoms and risks. Hormone replacement therapy is available to decrease symptoms and risks. You should talk to your health care provider about whether hormone replacement therapy is right for you. Use sunscreen. Apply sunscreen liberally and repeatedly throughout the day. You should seek shade when your shadow is shorter than you. Protect yourself by wearing long sleeves, pants, a wide-brimmed hat, and sunglasses year round, whenever you are outdoors. Once a month, do a whole body skin exam, using a mirror to look at the skin on your back. Tell your health care provider of new moles, moles that have irregular borders, moles that are larger than a pencil eraser, or moles that have changed in shape or color. Stay current with required vaccines (immunizations). Influenza vaccine. All adults should be immunized every year. Tetanus, diphtheria, and acellular pertussis (Td, Tdap) vaccine. Pregnant women should receive 1 dose of Tdap vaccine during each pregnancy. The dose should be obtained regardless of the length of time since the last dose. Immunization is preferred during the 27th-36th week of gestation. An adult who has not previously received Tdap or who does not know her vaccine status should receive 1 dose of Tdap. This initial dose should be followed by tetanus and diphtheria toxoids (Td) booster doses every 10 years. Adults with an unknown or incomplete history of completing a 3-dose immunization series with Td-containing vaccines should begin or complete a primary immunization series including a Tdap dose. Adults should receive a Td booster every 10 years.  Zoster vaccine. One dose is recommended for adults aged 47 years or older unless certain conditions are present.  Pneumococcal 13-valent conjugate (PCV13) vaccine. When indicated, a person who is uncertain of her immunization history and has no record of immunization should receive the PCV13  vaccine. An adult aged 63 years or older who has certain medical conditions and has not been previously immunized should receive 1 dose of PCV13 vaccine. This PCV13 should be followed with a dose of pneumococcal polysaccharide (PPSV23) vaccine. The PPSV23  vaccine dose should be obtained at least 1 or more year(s) after the dose of PCV13 vaccine. An adult aged 19 years or older who has certain medical conditions and previously received 1 or more doses of PPSV23 vaccine should receive 1 dose of PCV13. The PCV13 vaccine dose should be obtained 1 or more years after the last PPSV23 vaccine dose.  Pneumococcal polysaccharide (PPSV23) vaccine. When PCV13 is also indicated, PCV13 should be obtained first. All adults aged 65 years and older should be immunized. An adult younger than age 65 years who has certain medical conditions should be immunized. Any person who resides in a nursing home or long-term care facility should be immunized. An adult smoker should be immunized. People with an immunocompromised condition and certain other conditions should receive both PCV13 and PPSV23 vaccines. People with human immunodeficiency virus (HIV) infection should be immunized as soon as possible after diagnosis. Immunization during chemotherapy or radiation therapy should be avoided. Routine use of PPSV23 vaccine is not recommended for American Indians, [place] Natives, or people younger than 65 years unless there are medical conditions that require PPSV23 vaccine. When indicated, people who have unknown immunization and have no record of immunization should receive PPSV23 vaccine. One-time revaccination 5 years after the first dose of PPSV23 is recommended for people aged 19-64 years who have chronic kidney failure, nephrotic syndrome, asplenia, or immunocompromised conditions. People who received 1-2 doses of PPSV23 before age 65 years should receive another dose of PPSV23 vaccine at age 65 years or later if at least 5 years have  passed since the previous dose. Doses of PPSV23 are not needed for people immunized with PPSV23 at or after age 65 years.  Preventive Services / Frequency  Ages 65 years and over Blood pressure check. Lipid and cholesterol check. Lung cancer screening. / Every year if you are aged 55-80 years and have a 30-pack-year history of smoking and currently smoke or have quit within the past 15 years. Yearly screening is stopped once you have quit smoking for at least 15 years or develop a health problem that would prevent you from having lung cancer treatment. Clinical breast exam.** / Every year after age 40 years.  BRCA-related cancer risk assessment.** / For women who have family members with a BRCA-related cancer (breast, ovarian, tubal, or peritoneal cancers). Mammogram.** / Every year beginning at age 40 years and continuing for as long as you are in good health. Consult with your health care provider. Pap test.** / Every 3 years starting at age 30 years through age 65 or 70 years with 3 consecutive normal Pap tests. Testing can be stopped between 65 and 70 years with 3 consecutive normal Pap tests and no abnormal Pap or HPV tests in the past 10 years. Fecal occult blood test (FOBT) of stool. / Every year beginning at age 50 years and continuing until age 75 years. You may not need to do this test if you get a colonoscopy every 10 years. Flexible sigmoidoscopy or colonoscopy.** / Every 5 years for a flexible sigmoidoscopy or every 10 years for a colonoscopy beginning at age 50 years and continuing until age 75 years. Hepatitis C blood test.** / For all people born from 1945 through 1965 and any individual with known risks for hepatitis C. Osteoporosis screening.** / A one-time screening for women ages 65 years and over and women at risk for fractures or osteoporosis. Skin self-exam. / Monthly. Influenza vaccine. / Every year. Tetanus, diphtheria, and acellular pertussis (Tdap/Td) vaccine.** /   1 dose  of Td every 10 years. Zoster vaccine.** / 1 dose for adults aged 60 years or older. Pneumococcal 13-valent conjugate (PCV13) vaccine.** / Consult your health care provider. Pneumococcal polysaccharide (PPSV23) vaccine.** / 1 dose for all adults aged 65 years and older. Screening for abdominal aortic aneurysm (AAA)  by ultrasound is recommended for people who have history of high blood pressure or who are current or former smokers. ++++++++++++++++++++ Recommend Adult Low Dose Aspirin or  coated  Aspirin 81 mg daily  To reduce risk of Colon Cancer 40 %,  Skin Cancer 26 % ,  Melanoma 46%  and  Pancreatic cancer 60% ++++++++++++++++++++ Vitamin D goal  is between 70-100.  Please make sure that you are taking your Vitamin D as directed.  It is very important as a natural anti-inflammatory  helping hair, skin, and nails, as well as reducing stroke and heart attack risk.  It helps your bones and helps with mood. It also decreases numerous cancer risks so please take it as directed.  Low Vit D is associated with a 200-300% higher risk for CANCER  and 200-300% higher risk for HEART   ATTACK  &  STROKE.   ...................................... It is also associated with higher death rate at younger ages,  autoimmune diseases like Rheumatoid arthritis, Lupus, Multiple Sclerosis.    Also many other serious conditions, like depression, Alzheimer's Dementia, infertility, muscle aches, fatigue, fibromyalgia - just to name a few. ++++++++++++++++++ Recommend the book "The END of DIETING" by Dr Joel Fuhrman  & the book "The END of DIABETES " by Dr Joel Fuhrman At [employer] - get book & Audio CD's    Being diabetic has a  300% increased risk for heart attack, stroke, cancer, and alzheimer- type vascular dementia. It is very important that you work harder with diet by avoiding all foods that are white. Avoid white rice (brown & wild rice is OK), white potatoes (sweetpotatoes in moderation is OK),  White bread or wheat bread or anything made out of white flour like bagels, donuts, rolls, buns, biscuits, cakes, pastries, cookies, pizza crust, and pasta (made from white flour & egg whites) - vegetarian pasta or spinach or wheat pasta is OK. Multigrain breads like Arnold's or [employer], or multigrain sandwich thins or flatbreads.  Diet, exercise and weight loss can reverse and cure diabetes in the early stages.  Diet, exercise and weight loss is very important in the control and prevention of complications of diabetes which affects every system in your body, ie. Brain - dementia/stroke, eyes - glaucoma/blindness, heart - heart attack/heart failure, kidneys - dialysis, stomach - gastric paralysis, intestines - malabsorption, nerves - severe painful neuritis, circulation - gangrene & loss of a leg(s), and finally cancer and Alzheimers.    I recommend avoid fried & greasy foods,  sweets/candy, white rice (brown or wild rice or Quinoa is OK), white potatoes (sweet potatoes are OK) - anything made from white flour - bagels, doughnuts, rolls, buns, biscuits,white and wheat breads, pizza crust and traditional pasta made of white flour & egg white(vegetarian pasta or spinach or wheat pasta is OK).  Multi-grain bread is OK - like multi-grain flat bread or sandwich thins. Avoid alcohol in excess. Exercise is also important.    Eat all the vegetables you want - avoid meat, especially red meat and dairy - especially cheese.  Cheese is the most concentrated form of trans-fats which is the worst thing to clog up our arteries. Veggie cheese is OK   which can be found in the fresh produce section at [employer] or [employer] or [employer]  +++++++++++++++++++ DASH Eating Plan  DASH stands for "Dietary Approaches to Stop Hypertension."   The DASH eating plan is a healthy eating plan that has been shown to reduce high blood pressure (hypertension). Additional health benefits may include reducing the risk of type 2  diabetes mellitus, heart disease, and stroke. The DASH eating plan may also help with weight loss. WHAT DO I NEED TO KNOW ABOUT THE DASH EATING PLAN? For the DASH eating plan, you will follow these general guidelines: Choose foods with a percent daily value for sodium of less than 5% (as listed on the food label). Use salt-free seasonings or herbs instead of table salt or sea salt. Check with your health care provider or pharmacist before using salt substitutes. Eat lower-sodium products, often labeled as "lower sodium" or "no salt added." Eat fresh foods. Eat more vegetables, fruits, and low-fat dairy products. Choose whole grains. Look for the word "whole" as the first word in the ingredient list. Choose fish  Limit sweets, desserts, sugars, and sugary drinks. Choose heart-healthy fats. Eat veggie cheese  Eat more home-cooked food and less restaurant, buffet, and fast food. Limit fried foods. Cook foods using methods other than frying. Limit canned vegetables. If you do use them, rinse them well to decrease the sodium. When eating at a restaurant, ask that your food be prepared with less salt, or no salt if possible.                      WHAT FOODS CAN I EAT? Read Dr Joel Fuhrman's books on The End of Dieting & The End of Diabetes  Grains Whole grain or whole wheat bread. Brown rice. Whole grain or whole wheat pasta. Quinoa, bulgur, and whole grain cereals. Low-sodium cereals. Corn or whole wheat flour tortillas. Whole grain cornbread. Whole grain crackers. Low-sodium crackers.  Vegetables Fresh or frozen vegetables (raw, steamed, roasted, or grilled). Low-sodium or reduced-sodium tomato and vegetable juices. Low-sodium or reduced-sodium tomato sauce and paste. Low-sodium or reduced-sodium canned vegetables.   Fruits All fresh, canned (in natural juice), or frozen fruits.  Protein Products  All fish and seafood.  Dried beans, peas, or lentils. Unsalted nuts and seeds. Unsalted  canned beans.  Dairy Low-fat dairy products, such as skim or 1% milk, 2% or reduced-fat cheeses, low-fat ricotta or cottage cheese, or plain low-fat yogurt. Low-sodium or reduced-sodium cheeses.  Fats and Oils Tub margarines without trans fats. Light or reduced-fat mayonnaise and salad dressings (reduced sodium). Avocado. Safflower, olive, or canola oils. Natural peanut or almond butter.  Other Unsalted popcorn and pretzels. The items listed above may not be a complete list of recommended foods or beverages. Contact your dietitian for more options.  +++++++++++++++  WHAT FOODS ARE NOT RECOMMENDED? Grains/ White flour or wheat flour White bread. White pasta. White rice. Refined cornbread. Bagels and croissants. Crackers that contain trans fat.  Vegetables  Creamed or fried vegetables. Vegetables in a . Regular canned vegetables. Regular canned tomato sauce and paste. Regular tomato and vegetable juices.  Fruits Dried fruits. Canned fruit in light or heavy syrup. Fruit juice.  Meat and Other Protein Products Meat in general - RED meat & White meat.  Fatty cuts of meat. Ribs, chicken wings, all processed meats as bacon, sausage, bologna, salami, fatback, hot dogs, bratwurst and packaged luncheon meats.  Dairy Whole or 2% milk, cream, half-and-half, and cream cheese.   Whole-fat or sweetened yogurt. Full-fat cheeses or blue cheese. Non-dairy creamers and whipped toppings. Processed cheese, cheese spreads, or cheese curds.  Condiments Onion and garlic salt, seasoned salt, table salt, and sea salt. Canned and packaged gravies. Worcestershire sauce. Tartar sauce. Barbecue sauce. Teriyaki sauce. Soy sauce, including reduced sodium. Steak sauce. Fish sauce. Oyster sauce. Cocktail sauce. Horseradish. Ketchup and mustard. Meat flavorings and tenderizers. Bouillon cubes. Hot sauce. Tabasco sauce. Marinades. Taco seasonings. Relishes.  Fats and Oils Butter, stick margarine, lard, shortening and  bacon fat. Coconut, palm kernel, or palm oils. Regular salad dressings.  Pickles and olives. Salted popcorn and pretzels.Respiratory Syncytial Virus Infection, Adult Respiratory syncytial virus (RSV) infection is an infection caused by RSV, a common virus. This virus is similar to viruses that cause the common cold and the flu. RSV infection can affect the nose, throat, windpipe, and lungs (respiratory system). When the infection is severe, it can cause: Bronchiolitis. This condition causes inflammation of the air passages in the lungs (bronchioles). Pneumonia. This condition causes inflammation of the air sacs in the lungs. RSV infection spreads from person to person (is contagious) through droplets from coughs and sneezes (respiratory secretions). This condition is rarely serious when it occurs in adults. What are the causes? This condition is caused by contact with RSV. This can happen by: Breathing respiratory secretions from someone who has the infection. Touching something that has been exposed to the virus (is contaminated) and then touching your mouth, nose, or eyes. Coming in close contact with someone who has this infection. This may happen if you: Hug or kiss. Shake or hold hands. Eat or drink using the same dishes or utensils. What increases the risk? The following factors may make you more likely to develop this condition: Being 56 years of age or older. Having certain health conditions, including: A long-term (chronic) lung condition, such as chronic obstructive pulmonary disease (COPD). An immune system that is weak. This is your body's defense system. Down syndrome. Heart disease. Working in a hospital or other health care facility. Living in a long-term health care facility. RSV infections are most common from the months of November to April, but they can happen any time of year. What are the signs or symptoms? Symptoms of this condition include: Having a runny  nose. Coughing. You may have a cough that brings up mucus (productive cough). Sneezing. Having a fever. Wanting to eat less than usual. Breathing loudly (wheezing). Having shortness of breath. Having fluid build up in the lungs (respiratory distress). How is this diagnosed? This condition may be diagnosed based on: Your symptoms. Your medical history. A physical exam. A chest X-ray to rule out pneumonia. Blood tests or tests of mucus from your lungs (sputum). These tests may be done for older adults. A test of a sample of your respiratory secretions. How is this treated? In most cases, the RSV infection will go away after 1-2 weeks of caring for yourself at home.  Sometimes, RSV infection is severe and can cause bronchiolitis or pneumonia. If you develop one or both of these conditions, you may need to be treated in the hospital. You may be given: Oxygen therapy. Antiviral medicine. Medicines to open your bronchioles (bronchodilators). Follow these instructions at home: Medicines Take over-the-counter and prescription medicines only as told by your health care provider. If you were prescribed an antiviral medicine, take it as told by your health care provider. Do not stop using the antiviral even if you start to feel better. Lifestyle  Eat a healthy diet. Do not drink alcohol. Do not use any products that contain nicotine or tobacco, such as cigarettes, e-cigarettes, and chewing tobacco. If you need help quitting, ask your health care provider. Rest at home until your symptoms go away. Return to your normal activities as told by your health care provider. Ask your health care provider what activities are safe for you. General instructions  Drink enough fluid to keep your urine pale yellow. Gargle with a salt-water mixture 3-4 times a day or as needed. To make a salt-water mixture, completely dissolve -1 tsp (3-6 g) of salt in 1 cup (237 mL) of warm water. Keep all follow-up  visits as told by your health care provider. This is important. How is this prevented? To prevent catching and spreading RSV: Wash your hands often with soap and water for at least 20 seconds. If soap and water are not available, use hand sanitizer. Do not touch your face without first cleaning your hands. Stay home if you have symptoms of the common cold or the flu. Cover your nose and mouth when you cough or sneeze. Avoid large groups of people. Keep a safe distance of about 6 feet (1.8 m) from people who are coughing or sneezing. Where to find more information Centers for Disease Control and Prevention: http://www.wolf.info/ Contact a health care provider if: Your symptoms get worse or have not changed after 2 weeks. You have: A fever. Hot flashes, sweating, or chills that keep happening. A cough that brings up much more mucus than usual. A cough that brings up blood. You feel: Very tired (lethargic). Confused. Get help right away if: You have increased or severe trouble breathing. You lose consciousness. These symptoms may represent a serious problem that is an emergency. Do not wait to see if the symptoms will go away. Get medical help right away. Call your local emergency services (911 in the [place] Do not drive yourself to the hospital. Summary Respiratory syncytial virus (RSV) infection is an infection caused by RSV, a common virus. RSV infection can affect the nose, throat, windpipe, and lungs (respiratory system). When the infection is severe, it can cause bronchiolitis or pneumonia. Take over-the-counter and prescription medicines only as told by your health care provider. Contact a health care provider if your symptoms get worse or have not changed after 2 weeks. This information is not intended to replace advice given to you by your health care provider. Make sure you discuss any questions you have with your health care provider. Document Revised: 12/10/2018 Document Reviewed:  12/17/2018 Elsevier Patient Education  [employer].   Respiratory Syncytial Virus (RSV) Vaccine Injection What is this medication? RESPIRATORY SYNCYTIAL VIRUS VACCINE (reh SPIR uh tor ee sin SISH uhl VY rus vak SEEN) reduces the risk of respiratory syncytial virus (RSV). It does not treat RSV. It is still possible to get RSV after receiving this vaccine, but the symptoms may be less severe or not last as long. It works by helping your immune system learn how to fight off a future infection. This medicine may be used for other purposes; ask your health care provider or pharmacist if you have questions. COMMON BRAND NAME(S): ABRYSVO, AREXVY What should I tell my care team before I take this medication? They need to know if you have any of these conditions: Immune system problems An unusual or allergic reaction to respiratory syncytial virus vaccine, other medications, foods, dyes, or preservatives Pregnant or trying to get pregnant Breastfeeding How should  I use this medication? This vaccine is injected into a muscle. It is given by your care team. A copy of Vaccine Information Statements will be given before each vaccination. Be sure to read this information carefully each time. This sheet may change often. A copy of Vaccine Information Statements will be given before each vaccination. Be sure to read this information carefully each time. This sheet may change often. Talk to your care team about the use of this vaccine in children. It is not approved for use in children. Overdosage: If you think you have taken too much of this medicine contact a poison control center or emergency room at once. NOTE: This medicine is only for you. Do not share this medicine with others. What if I miss a dose? This does not apply. What may interact with this medication? Medications that lower your chance of fighting infection This list may not describe all possible interactions. Give your health care  provider a list of all the medicines, herbs, non-prescription drugs, or dietary supplements you use. Also tell them if you smoke, drink alcohol, or use illegal drugs. Some items may interact with your medicine. What should I watch for while using this medication? Visit your care team regularly. What side effects may I notice from receiving this medication? Side effects that you should report to your care team as soon as possible: Allergic reactions--skin rash, itching, hives, swelling of the face, lips, tongue, or throat Side effects that usually do not require medical attention (report these to your care team if they continue or are bothersome): Fatigue Feeling faint or lightheaded Headache Joint pain Muscle pain Pain, redness, or irritation at injection site This list may not describe all possible side effects. Call your doctor for medical advice about side effects. You may report side effects to FDA at 1-800-FDA-1088. Where should I keep my medication? This vaccine is only given by your care team. It will not be stored at home. NOTE: This sheet is a summary. It may not cover all possible information. If you have questions about this medicine, talk to your doctor, pharmacist, or health care provider.  2023 Elsevier/Gold Standard (2021-08-02 00:00:00)

## 2022-01-22 NOTE — Progress Notes (Unsigned)
Comprehensive Evaluation &  Examination  Future Appointments  Date Time Provider Department  01/23/2022                  cpe 10:00 AM Unk Maddocks, MD [facility]  10/12/2022                     wellness 11:00 AM Alycia Rossetti, NP [facility]  01/28/2023                  cpe 10:00 AM Unk Brownstein, MD [facility]        This very nice 76 y.o. MWF presents for a comprehensive evaluation and management of multiple medical co-morbidities.  Patient has been followed for HTN, ASCAD, HLD, Prediabetes  and Vitamin D Deficiency.         HTN predates since 2000.   In Jan 2020 , she had a PPM implanted for CHB.  In Mar 2020, She had PCA /Stent. Her Cardiologist is Dr Rayann Heman. Patient's BP has been controlled at home and patient denies any cardiac symptoms as chest pain, palpitations, shortness of breath, dizziness or ankle swelling. Today's BP is at goal -                                .         Patient's hyperlipidemia is controlled with diet and Rosuvastatin.  Patient denies myalgias or other medication SE's. Last lipids were at goal :  Lab Results  Component Value Date   CHOL 124 10/11/2021   HDL 49 (L) 10/11/2021   LDLCALC 56 10/11/2021   TRIG 105 10/11/2021   CHOLHDL 2.5 10/11/2021         Patient is monitored proactively for glucose intolerance and patient denies reactive hypoglycemic symptoms, visual blurring, diabetic polys or paresthesias. Last A1c was near goal :  Lab Results  Component Value Date   HGBA1C 5.7 (H) 06/26/2021         Finally, patient has history of Vitamin D Deficiency ("32" /2020) and last Vitamin D was at goal :  Lab Results  Component Value Date   VD25OH 70 12/11/2019     Current Outpatient Medications  Medication Instructions   amLODipine   5 mg Daily   VITAMIN C   1 ,000 mg Daily   aspirin EC  81 mg Daily   Vitamin D   10,000 u Daily   metoprolol succinate -XL  25 mg Daily   rosuvastatin  20 MG tablet Daily   zinc   50 mg Daily      Allergies  Allergen Reactions   Penicillins      Past Medical History:  Diagnosis Date   Abnormal glucose 11/18/2018   Allergy    penicillin   Arteriosclerotic heart disease (ASHD) 11/18/2018   s/p PCI 05/2018   Cardiac pacemaker 11/18/2018   Complete heart block ([place]) 11/18/2018   Essential hypertension 11/18/2018   Hyperlipidemia 2010   Hypertension 2000   Osteoporosis      Health Maintenance  Topic Date Due   TETANUS/TDAP  Never done   COVID-19 Vaccine (4 - Booster) 03/24/2020   INFLUENZA VACCINE  10/10/2020   Hepatitis C Screening  Completed   Zoster Vaccines- Shingrix  Completed   HPV VACCINES  Aged Out   DEXA SCAN  Discontinued     Immunization History  Administered Date(s) Administered   QUALCOMM -high  Dose 12/30/2018   Influenza, High Dose  01/12/2020   Influenza,inj,quad 01/14/2017   PFIZER SARS-COV-2 Vacc 04/17/2019, 05/12/2019, 12/31/2019   Pneumococcal -13 04/12/2018   Pneumococcal -23 09/02/2019   Zoster Recombinat (Shingrix) 02/09/2018, 04/29/2018    Last Colon - 02/2017 in [place] and no f/u recc due to age.   Last MGM - 02/21/2021 -> and scheduled for 03/22/2022   Past Surgical History:  Procedure Laterality Date   CHOLECYSTECTOMY  2011   CORONARY ANGIOPLASTY WITH STENT PLACEMENT  05/2018   [facility]  2009   face   PACEMAKER IMPLANT  03/2018   MDT pacemaker implanted in [place] Right 2016     Family History  Problem Relation Age of Onset   Colon cancer Mother    Diabetes Father    Hypertension Brother      Social History   Tobacco Use   Smoking status: Never   Smokeless tobacco: Never  Substance Use Topics   Drug use: Never      ROS Constitutional: Denies fever, chills, weight loss/gain, headaches, insomnia,  night sweats, and change in appetite. Does c/o fatigue. Eyes: Denies redness, blurred vision, diplopia, discharge, itchy, watery eyes.  ENT: Denies discharge, congestion, post nasal drip, epistaxis, sore  throat, earache, hearing loss, dental pain, Tinnitus, Vertigo, Sinus pain, snoring.  Cardio: Denies chest pain, palpitations, irregular heartbeat, syncope, dyspnea, diaphoresis, orthopnea, PND, claudication, edema Respiratory: denies cough, dyspnea, DOE, pleurisy, hoarseness, laryngitis, wheezing.  Gastrointestinal: Denies dysphagia, heartburn, reflux, water brash, pain, cramps, nausea, vomiting, bloating, diarrhea, constipation, hematemesis, melena, hematochezia, jaundice, hemorrhoids Genitourinary: Denies dysuria, frequency, urgency, nocturia, hesitancy, discharge, hematuria, flank pain Breast: Breast lumps, nipple discharge, bleeding.  Musculoskeletal: Denies arthralgia, myalgia, stiffness, Jt. Swelling, pain, limp, and strain/sprain. Denies falls. Skin: Denies puritis, rash, hives, warts, acne, eczema, changing in skin lesion Neuro: No weakness, tremor, incoordination, spasms, paresthesia, pain Psychiatric: Denies confusion, memory loss, sensory loss. Denies Depression. Endocrine: Denies change in weight, skin, hair change, nocturia, and paresthesia, diabetic polys, visual blurring, hyper / hypo glycemic episodes.  Heme/Lymph: No excessive bleeding, bruising, enlarged lymph nodes.  Physical Exam  There were no vitals taken for this visit.  General Appearance: Well nourished, well groomed and in no apparent distress.  Eyes: PERRLA, EOMs, conjunctiva no swelling or erythema, normal fundi and vessels. Sinuses: No frontal/maxillary tenderness ENT/Mouth: EACs patent / TMs  nl. Nares clear without erythema, swelling, mucoid exudates. Oral hygiene is good. No erythema, swelling, or exudate. Tongue normal, non-obstructing. Tonsils not swollen or erythematous. Hearing normal.  Neck: Supple, thyroid not palpable. No bruits, nodes or JVD. Respiratory: Respiratory effort normal.  BS equal and clear bilateral without rales, rhonci, wheezing or stridor. Cardio: Heart sounds are normal with regular rate  and rhythm and no murmurs, rubs or gallops. Peripheral pulses are normal and equal bilaterally without edema. No aortic or femoral bruits. Chest: symmetric with normal excursions and percussion. Breasts: Patient deferred Exam to up coming MGM.  Abdomen: Flat, soft with bowel sounds active. Nontender, no guarding, rebound, hernias, masses, or organomegaly.  Lymphatics: Non tender without lymphadenopathy.  Musculoskeletal: Full ROM all peripheral extremities, joint stability, 5/5 strength, and normal gait. Skin: Warm and dry without rashes, lesions, cyanosis, clubbing or  ecchymosis.  Neuro: Cranial nerves intact, reflexes equal bilaterally. Normal muscle tone, no cerebellar symptoms. Sensation intact.  Pysch: Alert and oriented X 3, normal affect, Insight and Judgment appropriate.    Assessment and Plan    1. Essential hypertension  -  EKG 12-Lead - Urinalysis, Routine w reflex microscopic - Microalbumin / creatinine urine ratio - CBC with Differential/Platelet - COMPLETE METABOLIC PANEL WITH GFR - Magnesium - TSH  2. Hyperlipidemia, mixed  - EKG 12-Lead - Lipid panel - TSH  3. Abnormal glucose  - EKG 12-Lead - Hemoglobin A1c - Insulin, random  4. Vitamin D deficiency  - VITAMIN D 25 Hydroxy   5. Arteriosclerotic heart disease (ASHD)  - EKG 12-Lead - Lipid panel  6. Complete heart block (H[facility])  - EKG 12-Lead  7. Cardiac pacemaker  - EKG 12-Lead  8. Screening for colorectal cancer  - POC Hemoccult Bld/Stl   9. Screening for heart disease  - EKG 12-Lead - Lipid panel  10. FHx: heart disease  - EKG 12-Lead  11. Medication management  - Urinalysis, Routine w reflex microscopic - Microalbumin / creatinine urine ratio - CBC with Differential/Platelet - COMPLETE METABOLIC PANEL WITH GFR - Magnesium - Lipid panel - TSH - Hemoglobin A1c - Insulin, random - VITAMIN D 25 Hydroxy          Patient was counseled in prudent diet to achieve/maintain BMI  less than 25 for weight control, BP monitoring, regular exercise and medications. Discussed med's effects and SE's. Screening labs and tests as requested with regular follow-up as recommended. Over 40 minutes of exam, counseling, chart review and high complex critical decision making was performed.   Kirtland Bouchard, MD

## 2022-01-23 ENCOUNTER — Encounter: Payer: Self-pay | Admitting: Internal Medicine

## 2022-01-23 ENCOUNTER — Ambulatory Visit (INDEPENDENT_AMBULATORY_CARE_PROVIDER_SITE_OTHER): Payer: Medicare Other | Admitting: Internal Medicine

## 2022-01-23 VITALS — BP 130/78 | HR 90 | Temp 97.9°F | Resp 16 | Ht 62.0 in | Wt 158.6 lb

## 2022-01-23 DIAGNOSIS — Z1211 Encounter for screening for malignant neoplasm of colon: Secondary | ICD-10-CM

## 2022-01-23 DIAGNOSIS — Z950 Presence of cardiac pacemaker: Secondary | ICD-10-CM

## 2022-01-23 DIAGNOSIS — Z800 Family history of malignant neoplasm of digestive organs: Secondary | ICD-10-CM

## 2022-01-23 DIAGNOSIS — Z79899 Other long term (current) drug therapy: Secondary | ICD-10-CM

## 2022-01-23 DIAGNOSIS — E782 Mixed hyperlipidemia: Secondary | ICD-10-CM

## 2022-01-23 DIAGNOSIS — E559 Vitamin D deficiency, unspecified: Secondary | ICD-10-CM

## 2022-01-23 DIAGNOSIS — Z136 Encounter for screening for cardiovascular disorders: Secondary | ICD-10-CM

## 2022-01-23 DIAGNOSIS — R7309 Other abnormal glucose: Secondary | ICD-10-CM

## 2022-01-23 DIAGNOSIS — I10 Essential (primary) hypertension: Secondary | ICD-10-CM

## 2022-01-23 DIAGNOSIS — Z8249 Family history of ischemic heart disease and other diseases of the circulatory system: Secondary | ICD-10-CM

## 2022-01-23 DIAGNOSIS — I2510 Atherosclerotic heart disease of native coronary artery without angina pectoris: Secondary | ICD-10-CM

## 2022-01-23 DIAGNOSIS — I442 Atrioventricular block, complete: Secondary | ICD-10-CM

## 2022-01-24 ENCOUNTER — Encounter: Payer: Self-pay | Admitting: Internal Medicine

## 2022-01-24 LAB — CBC WITH DIFFERENTIAL/PLATELET
Absolute Monocytes: 663 cells/uL (ref 200–950)
Basophils Absolute: 81 cells/uL (ref 0–200)
Basophils Relative: 1.3 %
Eosinophils Absolute: 341 cells/uL (ref 15–500)
Eosinophils Relative: 5.5 %
HCT: 41.9 % (ref 35.0–45.0)
Hemoglobin: 13.8 g/dL (ref 11.7–15.5)
Lymphs Abs: 2046 cells/uL (ref 850–3900)
MCH: 29.8 pg (ref 27.0–33.0)
MCHC: 32.9 g/dL (ref 32.0–36.0)
MCV: 90.5 fL (ref 80.0–100.0)
MPV: 10.3 fL (ref 7.5–12.5)
Monocytes Relative: 10.7 %
Neutro Abs: 3069 cells/uL (ref 1500–7800)
Neutrophils Relative %: 49.5 %
Platelets: 252 10*3/uL (ref 140–400)
RBC: 4.63 10*6/uL (ref 3.80–5.10)
RDW: 12.5 % (ref 11.0–15.0)
Total Lymphocyte: 33 %
WBC: 6.2 10*3/uL (ref 3.8–10.8)

## 2022-01-24 LAB — MAGNESIUM: Magnesium: 2.2 mg/dL (ref 1.5–2.5)

## 2022-01-24 LAB — COMPLETE METABOLIC PANEL WITH GFR
AG Ratio: 2.1 (calc) (ref 1.0–2.5)
ALT: 14 U/L (ref 6–29)
AST: 18 U/L (ref 10–35)
Albumin: 4.4 g/dL (ref 3.6–5.1)
Alkaline phosphatase (APISO): 69 U/L (ref 37–153)
BUN: 11 mg/dL (ref 7–25)
CO2: 28 mmol/L (ref 20–32)
Calcium: 9.4 mg/dL (ref 8.6–10.4)
Chloride: 104 mmol/L (ref 98–110)
Creat: 0.81 mg/dL (ref 0.60–1.00)
Globulin: 2.1 g/dL (calc) (ref 1.9–3.7)
Glucose, Bld: 98 mg/dL (ref 65–99)
Potassium: 4.6 mmol/L (ref 3.5–5.3)
Sodium: 140 mmol/L (ref 135–146)
Total Bilirubin: 0.7 mg/dL (ref 0.2–1.2)
Total Protein: 6.5 g/dL (ref 6.1–8.1)
eGFR: 74 mL/min/{1.73_m2} (ref >=60)

## 2022-01-24 LAB — URINALYSIS, ROUTINE W REFLEX MICROSCOPIC
Bilirubin Urine: NEGATIVE
Glucose, UA: NEGATIVE
Hgb urine dipstick: NEGATIVE
Ketones, ur: NEGATIVE
Leukocytes,Ua: NEGATIVE
Nitrite: NEGATIVE
Protein, ur: NEGATIVE
Specific Gravity, Urine: 1.01 (ref 1.001–1.035)
pH: 5.5 (ref 5.0–8.0)

## 2022-01-24 LAB — HEMOGLOBIN A1C
Hgb A1c MFr Bld: 5.7 % of total Hgb — ABNORMAL HIGH (ref <5.7)
Mean Plasma Glucose: 117 mg/dL
eAG (mmol/L): 6.5 mmol/L

## 2022-01-24 LAB — MICROALBUMIN / CREATININE URINE RATIO
Creatinine, Urine: 100 mg/dL (ref 20–275)
Microalb Creat Ratio: 5 mcg/mg creat (ref <30)
Microalb, Ur: 0.5 mg/dL

## 2022-01-24 LAB — TSH: TSH: 4.38 mIU/L (ref 0.40–4.50)

## 2022-01-24 LAB — INSULIN, RANDOM: Insulin: 10.3 u[IU]/mL

## 2022-01-24 LAB — VITAMIN D 25 HYDROXY (VIT D DEFICIENCY, FRACTURES): Vit D, 25-Hydroxy: 44 ng/mL (ref 30–100)

## 2022-01-24 LAB — LIPID PANEL
Cholesterol: 145 mg/dL (ref <200)
HDL: 64 mg/dL (ref >=50)
LDL Cholesterol (Calc): 59 mg/dL (calc)
Non-HDL Cholesterol (Calc): 81 mg/dL (calc) (ref <130)
Total CHOL/HDL Ratio: 2.3 (calc) (ref <5.0)
Triglycerides: 136 mg/dL (ref <150)

## 2022-01-24 NOTE — Progress Notes (Signed)
<><><><><><><><><><><><><><><><><><><><><><><><><><><><><><><><><> <><><><><><><><><><><><><><><><><><><><><><><><><><><><><><><><><> -   Test results slightly outside the reference range are not unusual. If there is anything important, I will review this with you,  otherwise it is considered normal test values.  If you have further questions,  please do not hesitate to contact me at the office or via My Chart.  <><><><><><><><><><><><><><><><><><><><><><><><><><><><><><><><><> <><><><><><><><><><><><><><><><><><><><><><><><><><><><><><><><><>  -  A1c test shows borderline high normal blood sugar,   So    - Avoid Sweets, Candy & White Stuff   - White Rice, White Potatoes, White Flour  - Breads &  Pasta <><><><><><><><><><><><><><><><><><><><><><><><><><><><><><><><><>  -  Total Chol = 145   &   LDL Chol = 59   - Both  Excellent   - Very low risk for Heart Attack  / Stroke <><><><><><><><><><><><><><><><><><><><><><><><><><><><><><><><><>  -  Vitamin D = 44 is a little low    - Vitamin D goal is between 70-100.   - Please make sure that you are taking your Vitamin D 10,000 units /day as directed.   - It is very important as a natural anti-inflammatory and helping the  immune system protect against viral infections, like the Covid-19    helping hair, skin, and nails, as well as reducing stroke and heart attack risk.   - It helps your bones and helps with mood.  - It also decreases numerous cancer risks so please  take it as directed.   - Low Vit D is associated with a 200-300% higher risk for  CANCER   and 200-300% higher risk for HEART   ATTACK  &  STROKE.    - It is also associated with higher death rate at younger ages,   autoimmune diseases like Rheumatoid arthritis, Lupus,  Multiple Sclerosis.     - Also many other serious conditions, like depression, Alzheimer's  Dementia, muscle aches, fatigue, fibromyalgia   <><><><><><><><><><><><><><><><><><><><><><><><><><><><><><><><><>  -  All Else - CBC - Kidneys - Electrolytes - Liver - Magnesium & Thyroid    - all  Normal / OK <><><><><><><><><><><><><><><><><><><><><><><><><><><><><><><><><> <><><><><><><><><><><><><><><><><><><><><><><><><><><><><><><><><>

## 2022-02-08 ENCOUNTER — Telehealth: Payer: Self-pay | Admitting: Gastroenterology

## 2022-02-08 NOTE — Telephone Encounter (Signed)
Good Morning Dr.Danis,  Supervising MD 11/29 PM  We received a referral on this patient to be seen for a screening colonoscopy. She has GI hx in [place]. Her last colonoscopy was in 2018 with the suggestion to have one every 5 years. Her pcp uploaded her most recent colonoscopy report into Epic in the media tab.  Please review and advise on scheduling. Thank you.

## 2022-02-12 ENCOUNTER — Encounter: Payer: Self-pay | Admitting: Gastroenterology

## 2022-02-12 NOTE — Telephone Encounter (Signed)
(  For documentation purposes -last colonoscopy 02/14/2017 for family history of colon cancer diagnosed in mother under age 78.  Complete exam, diverticulosis, internal hemorrhoids, no polyps.)  I reviewed 2018 colonoscopy report as well as her most recent primary care and cardiology office notes.  This patient can be directly booked for colonoscopy with me in the [facility].  Please help make the arrangements.  - HD

## 2022-02-12 NOTE — Telephone Encounter (Signed)
Patient has been scheduled

## 2022-02-23 ENCOUNTER — Ambulatory Visit (INDEPENDENT_AMBULATORY_CARE_PROVIDER_SITE_OTHER): Payer: Medicare Other

## 2022-02-23 DIAGNOSIS — I442 Atrioventricular block, complete: Secondary | ICD-10-CM

## 2022-02-23 LAB — CUP PACEART REMOTE DEVICE CHECK
Battery Remaining Longevity: 105 mo
Battery Voltage: 3 V
Brady Statistic AP VP Percent: 7.31 %
Brady Statistic AP VS Percent: 0 %
Brady Statistic AS VP Percent: 92.67 %
Brady Statistic AS VS Percent: 0.01 %
Brady Statistic RA Percent Paced: 7.3 %
Brady Statistic RV Percent Paced: 99.99 %
Date Time Interrogation Session: 20231214205632
Implantable Lead Connection Status: 753985
Implantable Lead Connection Status: 753985
Implantable Lead Implant Date: 20200102
Implantable Lead Implant Date: 20200102
Implantable Lead Location: 753859
Implantable Lead Location: 753860
Implantable Lead Model: 4076
Implantable Lead Model: 4076
Implantable Pulse Generator Implant Date: 20200102
Lead Channel Impedance Value: 323 Ohm
Lead Channel Impedance Value: 342 Ohm
Lead Channel Impedance Value: 418 Ohm
Lead Channel Impedance Value: 475 Ohm
Lead Channel Pacing Threshold Amplitude: 0.5 V
Lead Channel Pacing Threshold Amplitude: 0.75 V
Lead Channel Pacing Threshold Pulse Width: 0.4 ms
Lead Channel Pacing Threshold Pulse Width: 0.4 ms
Lead Channel Sensing Intrinsic Amplitude: 12 mV
Lead Channel Sensing Intrinsic Amplitude: 12 mV
Lead Channel Sensing Intrinsic Amplitude: 2.375 mV
Lead Channel Sensing Intrinsic Amplitude: 2.375 mV
Lead Channel Setting Pacing Amplitude: 1.5 V
Lead Channel Setting Pacing Amplitude: 2 V
Lead Channel Setting Pacing Pulse Width: 0.4 ms
Lead Channel Setting Sensing Sensitivity: 2 mV
Zone Setting Status: 755011

## 2022-03-15 NOTE — Progress Notes (Signed)
Remote pacemaker transmission.   

## 2022-03-16 ENCOUNTER — Ambulatory Visit (AMBULATORY_SURGERY_CENTER): Payer: Medicare Other

## 2022-03-16 VITALS — Ht 62.0 in | Wt 158.0 lb

## 2022-03-16 DIAGNOSIS — Z800 Family history of malignant neoplasm of digestive organs: Secondary | ICD-10-CM

## 2022-03-16 MED ORDER — NA SULFATE-K SULFATE-MG SULF 17.5-3.13-1.6 GM/177ML PO SOLN: 1.0000 | Freq: Once | ORAL | 0 refills | Status: AC

## 2022-03-16 NOTE — Progress Notes (Signed)

## 2022-03-22 ENCOUNTER — Ambulatory Visit: Payer: Medicare Other

## 2022-04-05 ENCOUNTER — Encounter: Payer: Self-pay | Admitting: Internal Medicine

## 2022-04-05 ENCOUNTER — Ambulatory Visit (AMBULATORY_SURGERY_CENTER): Payer: Medicare Other | Admitting: Gastroenterology

## 2022-04-05 ENCOUNTER — Encounter: Payer: Self-pay | Admitting: Gastroenterology

## 2022-04-05 VITALS — BP 117/54 | HR 89 | Temp 97.3°F | Resp 21 | Ht 62.0 in | Wt 158.0 lb

## 2022-04-05 DIAGNOSIS — D122 Benign neoplasm of ascending colon: Secondary | ICD-10-CM

## 2022-04-05 DIAGNOSIS — D128 Benign neoplasm of rectum: Secondary | ICD-10-CM | POA: Diagnosis not present

## 2022-04-05 DIAGNOSIS — Z800 Family history of malignant neoplasm of digestive organs: Secondary | ICD-10-CM

## 2022-04-05 DIAGNOSIS — Z1211 Encounter for screening for malignant neoplasm of colon: Secondary | ICD-10-CM | POA: Diagnosis not present

## 2022-04-05 MED ORDER — SODIUM CHLORIDE 0.9 % IV SOLN: 500.0000 mL | Freq: Once | INTRAVENOUS | Status: DC

## 2022-04-05 NOTE — Op Note (Signed)
[facility] Patient Name: Kristy [name] Procedure Date: 04/05/2022 9:24 AM MRN: [id number] Endoscopist: Mitchell. Loletha Carrow , MD, [id number] Age: 77 Referring MD:  Date of Birth: [birth year] Gender: Female Account #: [id number] Procedure:                Colonoscopy Indications:              Screening in patient at increased risk: Colorectal                            cancer in mother before age 16                           no polyps last colonoscopy Dec 2018 (in [place]) Medicines:                Monitored Anesthesia Care Procedure:                Pre-Anesthesia Assessment:                           - Prior to the procedure, a History and Physical                            was performed, and patient medications and                            allergies were reviewed. The patient's tolerance of                            previous anesthesia was also reviewed. The risks                            and benefits of the procedure and the sedation                            options and risks were discussed with the patient.                            All questions were answered, and informed consent                            was obtained. Prior Anticoagulants: The patient has                            taken no anticoagulant or antiplatelet agents. ASA                            Grade Assessment: II - A patient with mild systemic                            disease. After reviewing the risks and benefits,                            the patient was deemed in satisfactory condition to  undergo the procedure.                           After obtaining informed consent, the colonoscope                            was passed under direct vision. Throughout the                            procedure, the patient's blood pressure, pulse, and                            oxygen saturations were monitored continuously. The                            CF [id number] #[id number] was introduced  through the anus                            and advanced to the the cecum, identified by                            appendiceal orifice and ileocecal valve. The                            colonoscopy was performed without difficulty. The                            patient tolerated the procedure well. The quality                            of the bowel preparation was excellent. The                            ileocecal valve, appendiceal orifice, and rectum                            were photographed. The bowel preparation used was                            SUPREP via split dose instruction. Scope In: 9:40:42 AM Scope Out: 9:55:06 AM Scope Withdrawal Time: 0 hours 9 minutes 51 seconds  Total Procedure Duration: 0 hours 14 minutes 24 seconds  Findings:                 The perianal and digital rectal examinations were                            normal.                           An 8 mm polyp was found in the ascending colon. The                            polyp was flat. The polyp was removed with a cold  snare. Resection and retrieval were complete.                           A diminutive polyp was found in the proximal                            rectum. The polyp was sessile. The polyp was                            removed with a cold snare. Resection and retrieval                            were complete.                           Multiple diverticula were found in the left colon                            and right colon.                           Repeat examination of right colon under NBI                            performed.                           The exam was otherwise without abnormality on                            direct and retroflexion views. Complications:            No immediate complications. Estimated Blood Loss:     Estimated blood loss was minimal. Impression:               - One 8 mm polyp in the ascending colon, removed                             with a cold snare. Resected and retrieved.                           - One diminutive polyp in the proximal rectum,                            removed with a cold snare. Resected and retrieved.                           - Diverticulosis in the left colon and in the right                            colon.                           - The examination was otherwise normal on direct  and retroflexion views. Recommendation:           - Patient has a contact number available for                            emergencies. The signs and symptoms of potential                            delayed complications were discussed with the                            patient. Return to normal activities tomorrow.                            Written discharge instructions were provided to the                            patient.                           - Resume previous diet.                           - Continue present medications.                           - Await pathology results.                           - No repeat colonoscopy based on age and current                            guidelines.Marland Kitchen Laycie Schriner L. Loletha Carrow, MD 04/05/2022 10:01:37 AM This report has been signed electronically.

## 2022-04-05 NOTE — Patient Instructions (Addendum)
Resume previous diet. Continue present medications. Await pathology results. No repeat colonoscopy based on age and current guidelines..  Please read over handouts about polyps and diverticulosis   YOU HAD AN ENDOSCOPIC PROCEDURE TODAY AT [facility]:   Refer to the procedure report that was given to you for any specific questions about what was found during the examination.  If the procedure report does not answer your questions, please call your gastroenterologist to clarify.  If you requested that your care partner not be given the details of your procedure findings, then the procedure report has been included in a sealed envelope for you to review at your convenience later.  YOU SHOULD EXPECT: Some feelings of bloating in the abdomen. Passage of more gas than usual.  Walking can help get rid of the air that was put into your GI tract during the procedure and reduce the bloating. If you had a lower endoscopy (such as a colonoscopy or flexible sigmoidoscopy) you may notice spotting of blood in your stool or on the toilet paper. If you underwent a bowel prep for your procedure, you may not have a normal bowel movement for a few days.  Please Note:  You might notice some irritation and congestion in your nose or some drainage.  This is from the oxygen used during your procedure.  There is no need for concern and it should clear up in a day or so.  SYMPTOMS TO REPORT IMMEDIATELY:  Following lower endoscopy (colonoscopy or flexible sigmoidoscopy):  Excessive amounts of blood in the stool  Significant tenderness or worsening of abdominal pains  Swelling of the abdomen that is new, acute  Fever of 100F or higher  For urgent or emergent issues, a gastroenterologist can be reached at any hour by calling [phone]. Do not use MyChart messaging for urgent concerns.    DIET:  We do recommend a small meal at first, but then you may proceed to your regular diet.  Drink plenty of  fluids but you should avoid alcoholic beverages for 24 hours.  ACTIVITY:  You should plan to take it easy for the rest of today and you should NOT DRIVE or use heavy machinery until tomorrow (because of the sedation medicines used during the test).    FOLLOW UP: Our staff will call the number listed on your records the next business day following your procedure.  We will call around 7:15- 8:00 am to check on you and address any questions or concerns that you may have regarding the information given to you following your procedure. If we do not reach you, we will leave a message.     If any biopsies were taken you will be contacted by phone or by letter within the next 1-3 weeks.  Please call us at [phone] if you have not heard about the biopsies in 3 weeks.    SIGNATURES/CONFIDENTIALITY: You and/or your care partner have signed paperwork which will be entered into your electronic medical record.  These signatures attest to the fact that that the information above on your After Visit Summary has been reviewed and is understood.  Full responsibility of the confidentiality of this discharge information lies with you and/or your care-partner.

## 2022-04-05 NOTE — Progress Notes (Signed)
Called to room to assist during endoscopic procedure.  Patient ID and intended procedure confirmed with present staff. Received instructions for my participation in the procedure from the performing physician.  

## 2022-04-05 NOTE — Progress Notes (Signed)
History and Physical:  This patient presents for endoscopic testing for: Encounter Diagnosis  Name Primary?   Family history of malignant neoplasm of gastrointestinal tract Yes    Mother had CRC No polyps last colonoscopy in [place] 02/2017 Patient denies chronic abdominal pain, rectal bleeding, constipation or diarrhea.  Patient is otherwise without complaints or active issues today.   Past Medical History: Past Medical History:  Diagnosis Date   Abnormal glucose 11/18/2018   Allergy    penicillin   Arteriosclerotic heart disease (ASHD) 11/18/2018   s/p PCI 05/2018   Cardiac pacemaker 11/18/2018   Cataract    Complete heart block ([place]) 11/18/2018   Essential hypertension 11/18/2018   Hyperlipidemia 2010   Hypertension 2000   Osteoporosis      Past Surgical History: Past Surgical History:  Procedure Laterality Date   CATARACT EXTRACTION  2023   CHOLECYSTECTOMY  2011   CORONARY ANGIOPLASTY WITH STENT PLACEMENT  05/2018   mohl  2009   face   PACEMAKER IMPLANT  03/2018   MDT pacemaker implanted in [place] Right 2016    Allergies: Allergies  Allergen Reactions   Penicillins    Latex Rash    Outpatient Meds: Current Outpatient Medications  Medication Sig Dispense Refill   amLODipine (NORVASC) 5 MG tablet TAKE 1 TABLET BY MOUTH  DAILY 90 tablet 3   Ascorbic Acid (VITAMIN C PO) Take 1,000 mg by mouth daily.     aspirin EC 81 MG tablet Take 81 mg by mouth daily.     CHOLECALCIFEROL PO Take 10,000 Units by mouth daily.     metoprolol succinate (TOPROL-XL) 25 MG 24 hr tablet TAKE 1 TABLET BY MOUTH DAILY 90 tablet 3   rosuvastatin (CRESTOR) 20 MG tablet TAKE 1 TABLET BY MOUTH DAILY 90 tablet 3   zinc gluconate 50 MG tablet Take 50 mg by mouth daily.     Current Facility-Administered Medications  Medication Dose Route Frequency Provider Last Rate Last Admin   0.9 %  sodium chloride infusion  500 mL Intravenous Once Danis, Estill Cotta III, MD           ___________________________________________________________________ Objective   Exam:  BP (!) 131/56   Pulse 100   Temp (!) 97.3 F (36.3 C)   Ht '5\' 2"'$  (1.575 m)   Wt 158 lb (71.7 kg)   SpO2 99%   BMI 28.90 kg/m   CV: regular , S1/S2 Resp: clear to auscultation bilaterally, normal RR and effort noted GI: soft, no tenderness, with active bowel sounds.   Assessment: Encounter Diagnosis  Name Primary?   Family history of malignant neoplasm of gastrointestinal tract Yes     Plan: Colonoscopy  The benefits and risks of the planned procedure were described in detail with the patient or (when appropriate) their health care proxy.  Risks were outlined as including, but not limited to, bleeding, infection, perforation, adverse medication reaction leading to cardiac or pulmonary decompensation, pancreatitis (if ERCP).  The limitation of incomplete mucosal visualization was also discussed.  No guarantees or warranties were given.    The patient is appropriate for an endoscopic procedure in the ambulatory setting.   - Wilfrid Lund, MD

## 2022-04-05 NOTE — Progress Notes (Signed)
VS by DT  Pt's states no medical or surgical changes since previsit or office visit.  

## 2022-04-06 ENCOUNTER — Telehealth: Payer: Self-pay | Admitting: *Deleted

## 2022-04-06 NOTE — Telephone Encounter (Signed)
  Follow up Call-     04/05/2022    8:51 AM  Call back number  Post procedure Call Back phone  # [phone]  Permission to leave phone message Yes    No answer, machine didn't pick up

## 2022-04-10 ENCOUNTER — Encounter: Payer: Self-pay | Admitting: Gastroenterology

## 2022-05-07 ENCOUNTER — Encounter: Payer: Self-pay | Admitting: Nurse Practitioner

## 2022-05-07 ENCOUNTER — Ambulatory Visit (INDEPENDENT_AMBULATORY_CARE_PROVIDER_SITE_OTHER): Payer: Medicare Other | Admitting: Nurse Practitioner

## 2022-05-07 VITALS — BP 132/62 | HR 76 | Temp 97.7°F | Ht 62.0 in | Wt 156.4 lb

## 2022-05-07 DIAGNOSIS — Z950 Presence of cardiac pacemaker: Secondary | ICD-10-CM

## 2022-05-07 DIAGNOSIS — I442 Atrioventricular block, complete: Secondary | ICD-10-CM | POA: Diagnosis not present

## 2022-05-07 DIAGNOSIS — I2510 Atherosclerotic heart disease of native coronary artery without angina pectoris: Secondary | ICD-10-CM | POA: Diagnosis not present

## 2022-05-07 DIAGNOSIS — E782 Mixed hyperlipidemia: Secondary | ICD-10-CM

## 2022-05-07 DIAGNOSIS — I10 Essential (primary) hypertension: Secondary | ICD-10-CM

## 2022-05-07 DIAGNOSIS — E559 Vitamin D deficiency, unspecified: Secondary | ICD-10-CM

## 2022-05-07 DIAGNOSIS — E663 Overweight: Secondary | ICD-10-CM

## 2022-05-07 DIAGNOSIS — R7309 Other abnormal glucose: Secondary | ICD-10-CM

## 2022-05-07 DIAGNOSIS — Z6828 Body mass index (BMI) 28.0-28.9, adult: Secondary | ICD-10-CM

## 2022-05-07 DIAGNOSIS — Z79899 Other long term (current) drug therapy: Secondary | ICD-10-CM

## 2022-05-07 NOTE — Patient Instructions (Signed)

## 2022-05-07 NOTE — Progress Notes (Signed)
FOLLOW UP  Assessment and Plan:   Complete heart block/ S/p pacemaker Now follows with Dr. Rayann Heman Doing well s/p pacemaker Monitor   Arteriosclerotic heart disease S/p stent 05/2018; continues with DAPT - plavix, ASA - continue for now per cardiology Denies sx of excess bleeding; continue to monitor Control blood pressure, cholesterol, glucose, encourage lifestyle/exercise   Hypertension Well controlled with current medications  Monitor blood pressure at home; patient to call if consistently greater than 130/80 Continue DASH diet.   Reminder to go to the ER if any CP, SOB, nausea, dizziness, severe HA, changes vision/speech, left arm numbness and tingling and jaw pain.  Cholesterol Continue statin therapy for LDL  LDL goal of <70 reviewed; she would prefer to avoid increasing med dose, wants to work on lifestyle Continue low cholesterol diet and exercise.  Check lipid panel.   Other abnormal glucse Recent A1Cs at goal Discussed diet/exercise, weight management  Defer A1C; check CMP for serum glucose, monitor weight trends - Insulin, random  Overweight - BMI 28 Long discussion about weight loss, diet, and exercise Recommended diet heavy in fruits and veggies and low in animal meats, cheeses, and dairy products, appropriate calorie intake Discussed ideal weight for height  and initial weight goal (<150 lb) Has lost 2 pounds in past 2 months.  Continue limiting saturated fats and simple carbs Will follow up in 3 months  Vitamin D Def Below goal at last visit; newly taking 5000 IU daily  continue to recommend supplementation to maintain goal of 60-100 Defer Vit D level, check at follow up  Medication Management - CBC, CMP  Continue diet and meds as discussed. Further disposition pending results of labs. Discussed med's effects and SE's.   Over 30 minutes of exam, counseling, chart review, and critical decision making was performed.   Future Appointments  Date Time  Provider [place]  05/10/2022 12:20 PM [facility] MM 2 [facility]  05/25/2022  7:00 AM [facility] DEVICE REMOTES [facility]  08/08/2022 10:30 AM Unk Crispo, MD [facility] None  08/24/2022  7:00 AM [facility] DEVICE REMOTES [facility]  10/12/2022 11:00 AM Alycia Rossetti, NP [facility] None  11/23/2022  7:00 AM [facility] DEVICE REMOTES [facility]  01/28/2023 10:00 AM Unk Culverhouse, MD [facility] None    ----------------------------------------------------------------------------------------------------------------------  HPI 77 y.o. female  presents for 3 month follow up on hypertension, cholesterol, glucose management, weight and vitamin D deficiency.   She is following with [facility] Dr. Martin Majestic due to history of skin cancer, had moh's of left temple remotely. No recent concerns.    BMI is Body mass index is 28.61 kg/m., she has not been working on diet and exercise, living with Son and family and limited other than walking dog, hasn't been able to cook, eating out a lot, will do better once they can move into the new house.  Wt Readings from Last 3 Encounters:  05/07/22 156 lb 6.4 oz (70.9 kg)  04/05/22 158 lb (71.7 kg)  03/16/22 158 lb (71.7 kg)   Hx of complete heart block in 03/2018 s/p pacemaker and continues to do well; She is now established with Dr. Rayann Heman She had a (+) Nuclear stress test early 2020 and underwent cath and had a stent implanted (Mar 2020) and has done well since, continues with dual antiplatelet therapy (plavix, bASA)   Bp is currently well controlled with amlodipine 5 mg and metoprolol 25 mg QD , today their BP is BP: 132/62  BP Readings from  Last 3 Encounters:  05/07/22 132/62  04/05/22 (!) 117/54  01/23/22 130/78  She does not workout. She denies chest pain, shortness of breath, dizziness.  She will occasionally use metamucil for constipation  and this does relieve.  She  had her last colonoscopy 04/05/22 and does not have to have another.    She is on cholesterol medication Rosuvastatin 5 mg and denies myalgias. Her LDL cholesterol is at goal. Trigs remain mildly elevated. The cholesterol last visit was:   Lab Results  Component Value Date   CHOL 145 01/23/2022   HDL 64 01/23/2022   LDLCALC 59 01/23/2022   TRIG 136 01/23/2022   CHOLHDL 2.3 01/23/2022    She has not been working on diet and exercise for hx of abnormal glucose, and denies increased appetite, nausea, paresthesia of the feet, polydipsia, polyuria and visual disturbances. Last A1C in the office was:  Lab Results  Component Value Date   HGBA1C 5.7 (H) 01/23/2022    Patient is on Vitamin D supplement,  Lab Results  Component Value Date   VD25OH 44 01/23/2022        Current Medications:  Current Outpatient Medications on File Prior to Visit  Medication Sig   amLODipine (NORVASC) 5 MG tablet TAKE 1 TABLET BY MOUTH  DAILY   Ascorbic Acid (VITAMIN C PO) Take 1,000 mg by mouth daily.   aspirin EC 81 MG tablet Take 81 mg by mouth daily.   CHOLECALCIFEROL PO Take 10,000 Units by mouth daily.   metoprolol succinate (TOPROL-XL) 25 MG 24 hr tablet TAKE 1 TABLET BY MOUTH DAILY   rosuvastatin (CRESTOR) 20 MG tablet TAKE 1 TABLET BY MOUTH DAILY   zinc gluconate 50 MG tablet Take 50 mg by mouth daily.   No current facility-administered medications on file prior to visit.     Allergies:  Allergies  Allergen Reactions   Penicillins    Latex Rash     Medical History:  Past Medical History:  Diagnosis Date   Abnormal glucose 11/18/2018   Allergy    penicillin   Arteriosclerotic heart disease (ASHD) 11/18/2018   s/p PCI 05/2018   Cardiac pacemaker 11/18/2018   Cataract    Complete heart block ([place]) 11/18/2018   Essential hypertension 11/18/2018   Hyperlipidemia 2010   Hypertension 2000   Osteoporosis    Family history- Reviewed and unchanged Social history- Reviewed and  unchanged   Review of Systems:  Review of Systems  Constitutional:  Negative for malaise/fatigue and weight loss.  HENT:  Negative for hearing loss and tinnitus.   Eyes:  Negative for blurred vision and double vision.  Respiratory:  Negative for cough, shortness of breath and wheezing.   Cardiovascular:  Negative for chest pain, palpitations, orthopnea, claudication and leg swelling.  Gastrointestinal:  Positive for constipation (improved with Metamucil). Negative for abdominal pain, blood in stool, diarrhea, heartburn, melena, nausea and vomiting.  Genitourinary: Negative.   Musculoskeletal:  Negative for joint pain and myalgias.  Skin:  Negative for rash.  Neurological:  Negative for dizziness, tingling, sensory change, weakness and headaches.  Endo/Heme/Allergies:  Negative for polydipsia.  Psychiatric/Behavioral: Negative.    All other systems reviewed and are negative.   Physical Exam: BP 132/62   Pulse 76   Temp 97.7 F (36.5 C)   Ht '5\' 2"'$  (1.575 m)   Wt 156 lb 6.4 oz (70.9 kg)   SpO2 97%   BMI 28.61 kg/m  Wt Readings from Last 3 Encounters:  05/07/22 156 lb  6.4 oz (70.9 kg)  04/05/22 158 lb (71.7 kg)  03/16/22 158 lb (71.7 kg)   General Appearance: Well nourished, in no apparent distress. Eyes: PERRLA, EOMs, conjunctiva no swelling or erythema Sinuses: No Frontal/maxillary tenderness ENT/Mouth: Ext aud canals clear, TMs without erythema, bulging. No erythema, swelling, or exudate on post pharynx.  Tonsils not swollen or erythematous. Hearing normal.  Neck: Supple, thyroid normal.  Respiratory: Respiratory effort normal, BS equal bilaterally without rales, rhonchi, wheezing or stridor.  Cardio: RRR with no MRGs. Brisk peripheral pulses without edema.  Abdomen: Soft, + BS.  Non tender, no guarding, rebound, hernias, masses. Lymphatics: Non tender without lymphadenopathy.  Musculoskeletal: Full ROM, 5/5 strength, Normal gait Skin: Warm, dry without rashes, lesions,  ecchymosis. Left shin wound appears fully healed.   Neuro: Cranial nerves intact. No cerebellar symptoms.  Psych: Awake and oriented X 3, normal affect, Insight and Judgment appropriate.    Alycia Rossetti, NP 10:43 AM [facility]

## 2022-05-08 LAB — CBC WITH DIFFERENTIAL/PLATELET
Absolute Monocytes: 737 cells/uL (ref 200–950)
Basophils Absolute: 73 cells/uL (ref 0–200)
Basophils Relative: 1 %
Eosinophils Absolute: 329 cells/uL (ref 15–500)
Eosinophils Relative: 4.5 %
HCT: 40.5 % (ref 35.0–45.0)
Hemoglobin: 13.3 g/dL (ref 11.7–15.5)
Lymphs Abs: 1774 cells/uL (ref 850–3900)
MCH: 29.3 pg (ref 27.0–33.0)
MCHC: 32.8 g/dL (ref 32.0–36.0)
MCV: 89.2 fL (ref 80.0–100.0)
MPV: 10.5 fL (ref 7.5–12.5)
Monocytes Relative: 10.1 %
Neutro Abs: 4387 cells/uL (ref 1500–7800)
Neutrophils Relative %: 60.1 %
Platelets: 291 10*3/uL (ref 140–400)
RBC: 4.54 10*6/uL (ref 3.80–5.10)
RDW: 12.4 % (ref 11.0–15.0)
Total Lymphocyte: 24.3 %
WBC: 7.3 10*3/uL (ref 3.8–10.8)

## 2022-05-08 LAB — COMPLETE METABOLIC PANEL WITH GFR
AG Ratio: 1.8 (calc) (ref 1.0–2.5)
ALT: 15 U/L (ref 6–29)
AST: 19 U/L (ref 10–35)
Albumin: 4.2 g/dL (ref 3.6–5.1)
Alkaline phosphatase (APISO): 72 U/L (ref 37–153)
BUN: 10 mg/dL (ref 7–25)
CO2: 28 mmol/L (ref 20–32)
Calcium: 9.5 mg/dL (ref 8.6–10.4)
Chloride: 105 mmol/L (ref 98–110)
Creat: 0.75 mg/dL (ref 0.60–1.00)
Globulin: 2.4 g/dL (calc) (ref 1.9–3.7)
Glucose, Bld: 101 mg/dL — ABNORMAL HIGH (ref 65–99)
Potassium: 4.6 mmol/L (ref 3.5–5.3)
Sodium: 141 mmol/L (ref 135–146)
Total Bilirubin: 0.9 mg/dL (ref 0.2–1.2)
Total Protein: 6.6 g/dL (ref 6.1–8.1)
eGFR: 81 mL/min/{1.73_m2} (ref >=60)

## 2022-05-08 LAB — LIPID PANEL
Cholesterol: 138 mg/dL (ref <200)
HDL: 57 mg/dL (ref >=50)
LDL Cholesterol (Calc): 59 mg/dL (calc)
Non-HDL Cholesterol (Calc): 81 mg/dL (calc) (ref <130)
Total CHOL/HDL Ratio: 2.4 (calc) (ref <5.0)
Triglycerides: 140 mg/dL (ref <150)

## 2022-05-08 LAB — INSULIN, RANDOM: Insulin: 10.2 u[IU]/mL

## 2022-05-10 ENCOUNTER — Ambulatory Visit
Admission: RE | Admit: 2022-05-10 | Discharge: 2022-05-10 | Disposition: A | Discharge status: Home / Self Care | Payer: Medicare Other | Source: Ambulatory Visit | Attending: Internal Medicine | Admitting: Internal Medicine

## 2022-05-10 DIAGNOSIS — Z1231 Encounter for screening mammogram for malignant neoplasm of breast: Secondary | ICD-10-CM

## 2022-05-25 ENCOUNTER — Ambulatory Visit: Payer: Medicare Other

## 2022-05-25 DIAGNOSIS — I442 Atrioventricular block, complete: Secondary | ICD-10-CM | POA: Diagnosis not present

## 2022-05-27 LAB — CUP PACEART REMOTE DEVICE CHECK
Battery Remaining Longevity: 104 mo
Battery Voltage: 2.99 V
Brady Statistic AP VP Percent: 1.7 %
Brady Statistic AP VS Percent: 0 %
Brady Statistic AS VP Percent: 98.03 %
Brady Statistic AS VS Percent: 0.27 %
Brady Statistic RA Percent Paced: 1.74 %
Brady Statistic RV Percent Paced: 99.73 %
Date Time Interrogation Session: 20240315070816
Implantable Lead Connection Status: 753985
Implantable Lead Connection Status: 753985
Implantable Lead Implant Date: 20200102
Implantable Lead Implant Date: 20200102
Implantable Lead Location: 753859
Implantable Lead Location: 753860
Implantable Lead Model: 4076
Implantable Lead Model: 4076
Implantable Pulse Generator Implant Date: 20200102
Lead Channel Impedance Value: 323 Ohm
Lead Channel Impedance Value: 342 Ohm
Lead Channel Impedance Value: 418 Ohm
Lead Channel Impedance Value: 494 Ohm
Lead Channel Pacing Threshold Amplitude: 0.5 V
Lead Channel Pacing Threshold Amplitude: 0.625 V
Lead Channel Pacing Threshold Pulse Width: 0.4 ms
Lead Channel Pacing Threshold Pulse Width: 0.4 ms
Lead Channel Sensing Intrinsic Amplitude: 14.125 mV
Lead Channel Sensing Intrinsic Amplitude: 14.125 mV
Lead Channel Sensing Intrinsic Amplitude: 3 mV
Lead Channel Sensing Intrinsic Amplitude: 3 mV
Lead Channel Setting Pacing Amplitude: 1.5 V
Lead Channel Setting Pacing Amplitude: 2 V
Lead Channel Setting Pacing Pulse Width: 0.4 ms
Lead Channel Setting Sensing Sensitivity: 2 mV
Zone Setting Status: 755011

## 2022-06-26 NOTE — Progress Notes (Signed)
Remote pacemaker transmission.   

## 2022-08-02 ENCOUNTER — Other Ambulatory Visit: Payer: Self-pay

## 2022-08-02 MED ORDER — AMLODIPINE BESYLATE 5 MG PO TABS: 5.0000 mg | ORAL_TABLET | Freq: Every day | ORAL | 3 refills | Status: AC

## 2022-08-07 NOTE — Progress Notes (Unsigned)
Future Appointments  Date Time Provider Department  08/08/2022 10:30 AM [name], MD [facility]  10/12/2022 11:00 AM [name], NP [facility]  01/28/2023 10:00 AM [name], MD [facility]    History of Present Illness:       This very nice 77 y.o. MWF  presents for 6 month follow up with HTN, ASCAD/PPM, HLD, Prediabetes  and Vitamin D Deficiency.        Patient is treated for HTN  since 2000 & BP has been controlled at home.  In Jan 2020, she had a PPM for CHB. BP IS at goal -                                    . Patient has had no complaints of any cardiac type chest pain, palpitations, dyspnea Kristy [name] /PND, dizziness, claudication or dependent edema.        Hyperlipidemia is controlled with diet & Rosuvastatin.  Patient denies myalgias or other med SE's. Last Lipids were at goal except slightly elevated  Trig's:  Lab Results  Component Value Date   CHOL 138 05/07/2022   HDL 57 05/07/2022   LDLCALC 59 05/07/2022   TRIG 140 05/07/2022   CHOLHDL 2.4 05/07/2022     Also,  the patient is monitored proactively for glucose intolerance  and has had no symptoms of reactive hypoglycemia, diabetic polys, paresthesias or visual blurring.  Last A1c was near goal :  Lab Results  Component Value Date   HGBA1C 5.7 (H) 01/23/2022                                                          Further, the patient also has history of Vitamin D Deficiency ("32" /2020) and supplements vitamin D . Last vitamin D was still low (goal 70-100) :  Lab Results  Component Value Date   VD25OH 44 01/23/2022       Current Outpatient Medications on File Prior to Visit  Medication Sig   amLODipine   5 MG tablet Take 1 tablet  daily.   VITAMIN C 1,000 mg  Take daily.   aspirin EC 81 MG tablet Take daily.   CHOLECALCIFEROL 10,000 Units  Take daily.   Lutein 20 MG CAPS Take 1 capsule daily.   metoprolol succinate -XL 25 MG  Take 1 tablet daily.   rosuvastatin  20 MG tablet  TAKE 1 TABLET   DAILY   zinc 50 MG tablet Take  daily.      Allergies  Allergen Reactions   Penicillins      PMHx:   Past Medical History:  Diagnosis Date   Abnormal glucose 11/18/2018   Allergy    penicillin   Arteriosclerotic heart disease (ASHD) 11/18/2018   s/p PCI 05/2018   Cardiac pacemaker 11/18/2018   Complete heart block (HCC) 11/18/2018   Essential hypertension 11/18/2018   Hyperlipidemia 2010   Hypertension 2000   Osteoporosis      Immunization History  Administered Date(s) Administered   Fluad Quad (high Dose ) 12/30/2018   Influenza, High Dose  01/12/2020, 12/26/2020   Influenza,inj,quad 01/14/2017   PFIZER-SARS-COV-2 Vacc 04/17/2019, 05/12/2019, 12/31/2019   Pneumococcal -13 04/12/2018   Pneumococcal -23 09/02/2019  Zoster Recombinat (Shingrix) 02/09/2018, 04/29/2018     Past Surgical History:  Procedure Laterality Date   CHOLECYSTECTOMY  2011   CORONARY ANGIOPLASTY WITH STENT PLACEMENT  05/2018   mohl  2009   face   PACEMAKER IMPLANT  03/2018   MDT pacemaker implanted in [place]   ROTATOR CUFF REPAIR Right 2016    FHx:    Reviewed / unchanged  SHx:    Reviewed / unchanged   Systems Review:  Constitutional: Denies fever, chills, wt changes, headaches, insomnia, fatigue, night sweats, change in appetite. Eyes: Denies redness, blurred vision, diplopia, discharge, itchy, watery eyes.  ENT: Denies discharge, congestion, post nasal drip, epistaxis, sore throat, earache, hearing loss, dental pain, tinnitus, vertigo, sinus pain, snoring.  CV: Denies chest pain, palpitations, irregular heartbeat, syncope, dyspnea, diaphoresis, orthopnea, PND, claudication or edema. Respiratory: denies cough, dyspnea, DOE, pleurisy, hoarseness, laryngitis, wheezing.  Gastrointestinal: Denies dysphagia, odynophagia, heartburn, reflux, water brash, abdominal pain or cramps, nausea, vomiting, bloating, diarrhea, constipation, hematemesis, melena, hematochezia  or  hemorrhoids. Genitourinary: Denies dysuria, frequency, urgency, nocturia, hesitancy, discharge, hematuria or flank pain. Musculoskeletal: Denies arthralgias, myalgias, stiffness, jt. swelling, pain, limping or strain/sprain.  Skin: Denies pruritus, rash, hives, warts, acne, eczema or change in skin lesion(s). Neuro: No weakness, tremor, incoordination, spasms, paresthesia or pain. Psychiatric: Denies confusion, memory loss or sensory loss. Endo: Denies change in weight, skin or hair change.  Heme/Lymph: No excessive bleeding, bruising or enlarged lymph nodes.  Physical Exam  There were no vitals taken for this visit.  Appears  well nourished, well groomed  and in no distress.  Eyes: PERRLA, EOMs, conjunctiva no swelling or erythema. Sinuses: No frontal/maxillary tenderness ENT/Mouth: EAC's clear, TM's nl w/o erythema, bulging. Nares clear w/o erythema, swelling, exudates. Oropharynx clear without erythema or exudates. Oral hygiene is good. Tongue normal, non obstructing. Hearing intact.  Neck: Supple. Thyroid not palpable. Car 2+/2+ without bruits, nodes or JVD. Chest: Respirations nl with BS clear & equal w/o rales, rhonchi, wheezing or stridor.  Cor: Heart sounds normal w/ regular rate and rhythm without sig. murmurs, gallops, clicks or rubs. Peripheral pulses normal and equal  without edema.  Abdomen: Soft & bowel sounds normal. Non-tender w/o guarding, rebound, hernias, masses or organomegaly.  Lymphatics: Unremarkable.  Musculoskeletal: Full ROM all peripheral extremities, joint stability, 5/5 strength and normal gait.  Skin: Warm, dry without exposed rashes, lesions or ecchymosis apparent.  Neuro: Cranial nerves intact, reflexes equal bilaterally. Sensory-motor testing grossly intact. Tendon reflexes grossly intact.  Pysch: Alert & oriented x 3.  Insight and judgement nl & appropriate. No ideations.  Assessment and Plan:  1. Essential hypertension  - Continue medication, monitor  blood pressure at home.  - Continue DASH diet.  Reminder to go to the ER if any CP,  SOB, nausea, dizziness, severe HA, changes vision/speech.     - CBC with Differential/Platelet - Magnesium - COMPLETE METABOLIC PANEL WITH GFR - TSH   2. Hyperlipidemia, mixed   - Continue diet/meds, exercise,& lifestyle modifications.  - Continue monitor periodic cholesterol/liver & renal functions      - Lipid panel - TSH   3. Abnormal glucose  - Continue diet, exercise  - Lifestyle modifications.  - Monitor appropriate labs      - Hemoglobin A1c - Insulin, random   4. Vitamin D deficiency  - Continue supplementation     - VITAMIN D 25 Hydroxy    5. Arteriosclerotic heart disease (ASHD)  - Lipid panel   6. Cardiac pacemaker  7. Medication management  - CBC with Differential/Platelet - Magnesium - COMPLETE METABOLIC PANEL WITH GFR - Lipid panel - TSH - Hemoglobin A1c - Insulin, random         Discussed  regular exercise, BP monitoring, weight control to achieve/maintain BMI less than 25 and discussed med and SE's. Recommended labs to assess /monitor clinical status .  I discussed the assessment and treatment plan with the patient. The patient was provided an opportunity to ask questions and all were answered. The patient agreed with the plan and demonstrated an understanding of the instructions.  I provided over 30 minutes of exam, counseling, chart review and  complex critical decision making.        The patient was advised to call back or seek an in-person evaluation if the symptoms worsen or if the condition fails to improve as anticipated.   Marinus Maw, MD

## 2022-08-08 ENCOUNTER — Ambulatory Visit (INDEPENDENT_AMBULATORY_CARE_PROVIDER_SITE_OTHER): Payer: Medicare Other | Admitting: Internal Medicine

## 2022-08-08 ENCOUNTER — Encounter: Payer: Self-pay | Admitting: Internal Medicine

## 2022-08-08 VITALS — BP 130/62 | HR 74 | Temp 97.5°F | Resp 16 | Ht 62.0 in | Wt 154.4 lb

## 2022-08-08 DIAGNOSIS — I10 Essential (primary) hypertension: Secondary | ICD-10-CM | POA: Diagnosis not present

## 2022-08-08 DIAGNOSIS — Z79899 Other long term (current) drug therapy: Secondary | ICD-10-CM

## 2022-08-08 DIAGNOSIS — E782 Mixed hyperlipidemia: Secondary | ICD-10-CM

## 2022-08-08 DIAGNOSIS — I2510 Atherosclerotic heart disease of native coronary artery without angina pectoris: Secondary | ICD-10-CM

## 2022-08-08 DIAGNOSIS — E559 Vitamin D deficiency, unspecified: Secondary | ICD-10-CM | POA: Diagnosis not present

## 2022-08-08 DIAGNOSIS — R7309 Other abnormal glucose: Secondary | ICD-10-CM | POA: Diagnosis not present

## 2022-08-08 DIAGNOSIS — Z950 Presence of cardiac pacemaker: Secondary | ICD-10-CM

## 2022-08-08 LAB — CBC WITH DIFFERENTIAL/PLATELET
Basophils Relative: 1.2 %
Eosinophils Absolute: 400 cells/uL (ref 15–500)
Eosinophils Relative: 6.9 %
Hemoglobin: 13.5 g/dL (ref 11.7–15.5)
Lymphs Abs: 1850 cells/uL (ref 850–3900)
MCHC: 33.2 g/dL (ref 32.0–36.0)
MCV: 88.3 fL (ref 80.0–100.0)
Monocytes Relative: 12.2 %
Platelets: 251 10*3/uL (ref 140–400)
RBC: 4.61 10*6/uL (ref 3.80–5.10)
WBC: 5.8 10*3/uL (ref 3.8–10.8)

## 2022-08-08 NOTE — Patient Instructions (Signed)

## 2022-08-09 LAB — COMPLETE METABOLIC PANEL WITH GFR
AG Ratio: 1.9 (calc) (ref 1.0–2.5)
ALT: 22 U/L (ref 6–29)
AST: 22 U/L (ref 10–35)
Albumin: 4.3 g/dL (ref 3.6–5.1)
Alkaline phosphatase (APISO): 66 U/L (ref 37–153)
BUN: 9 mg/dL (ref 7–25)
CO2: 26 mmol/L (ref 20–32)
Calcium: 9.1 mg/dL (ref 8.6–10.4)
Chloride: 103 mmol/L (ref 98–110)
Creat: 0.73 mg/dL (ref 0.60–1.00)
Globulin: 2.3 g/dL (calc) (ref 1.9–3.7)
Glucose, Bld: 94 mg/dL (ref 65–99)
Potassium: 4.4 mmol/L (ref 3.5–5.3)
Sodium: 138 mmol/L (ref 135–146)
Total Bilirubin: 0.9 mg/dL (ref 0.2–1.2)
Total Protein: 6.6 g/dL (ref 6.1–8.1)
eGFR: 84 mL/min/{1.73_m2} (ref >=60)

## 2022-08-09 LAB — TSH: TSH: 3.29 mIU/L (ref 0.40–4.50)

## 2022-08-09 LAB — LIPID PANEL
Cholesterol: 136 mg/dL (ref <200)
HDL: 61 mg/dL (ref >=50)
LDL Cholesterol (Calc): 53 mg/dL (calc)
Non-HDL Cholesterol (Calc): 75 mg/dL (calc) (ref <130)
Total CHOL/HDL Ratio: 2.2 (calc) (ref <5.0)
Triglycerides: 139 mg/dL (ref <150)

## 2022-08-09 LAB — CBC WITH DIFFERENTIAL/PLATELET
Absolute Monocytes: 708 cells/uL (ref 200–950)
Basophils Absolute: 70 cells/uL (ref 0–200)
HCT: 40.7 % (ref 35.0–45.0)
MCH: 29.3 pg (ref 27.0–33.0)
MPV: 10.1 fL (ref 7.5–12.5)
Neutro Abs: 2772 cells/uL (ref 1500–7800)
Neutrophils Relative %: 47.8 %
RDW: 12.6 % (ref 11.0–15.0)
Total Lymphocyte: 31.9 %

## 2022-08-09 LAB — HEMOGLOBIN A1C
Hgb A1c MFr Bld: 5.9 % of total Hgb — ABNORMAL HIGH (ref <5.7)
Mean Plasma Glucose: 123 mg/dL
eAG (mmol/L): 6.8 mmol/L

## 2022-08-09 LAB — INSULIN, RANDOM: Insulin: 5.4 u[IU]/mL

## 2022-08-09 LAB — VITAMIN D 25 HYDROXY (VIT D DEFICIENCY, FRACTURES): Vit D, 25-Hydroxy: 67 ng/mL (ref 30–100)

## 2022-08-09 LAB — MAGNESIUM: Magnesium: 2 mg/dL (ref 1.5–2.5)

## 2022-08-09 NOTE — Progress Notes (Signed)
^<^<^<^<^<^<^<^<^<^<^<^<^<^<^<^<^<^<^<^<^<^<^<^<^<^<^<^<^<^<^<^<^<^<^<^<^ ^>^>^>^>^>^>^>^>^>^>^>>^>^>^>^>^>^>^>^>^>^>^>^>^>^>^>^>^>^>^>^>^>^>^>^>^>  -  Test results slightly outside the reference range are not unusual. If there is anything important, I will review this with you,  otherwise it is considered normal test values.  If you have further questions,  please do not hesitate to contact me at the office or via My Chart.   ^<^<^<^<^<^<^<^<^<^<^<^<^<^<^<^<^<^<^<^<^<^<^<^<^<^<^<^<^<^<^<^<^<^<^<^<^ ^>^>^>^>^>^>^>^>^>^>^>^>^>^>^>^>^>^>^>^>^>^>^>^>^>^>^>^>^>^>^>^>^>^>^>^>^  -   A1c = 5.9%   Blood sugar and A1c are elevated in the borderline and                                                            early or pre-diabetes range which has the same   300% increased risk for heart attack, stroke, cancer and                                              alzheimer- type vascular dementia as full blown diabetes.   But the good news is that diet, exercise with                                                      weight loss can cure the early diabetes at this point.  - So . . .   - Avoid Sweets, Candy & White Stuff   - White Rice, White Keller, White Flour  - Breads &  Pasta   ^<^<^<^<^<^<^<^<^<^<^<^<^<^<^<^<^<^<^<^<^<^<^<^<^<^<^<^<^<^<^<^<^<^<^<^<^ ^>^>^>^>^>^>^>^>^>^>^>^>^>^>^>^>^>^>^>^>^>^>^>^>^>^>^>^>^>^>^>^>^>^>^>^>^  -  Chol = 136   &  bad   LDL Chol = 53 is Low  - Great   !  ^<^<^<^<^<^<^<^<^<^<^<^<^<^<^<^<^<^<^<^<^<^<^<^<^<^<^<^<^<^<^<^<^<^<^<^<^ ^>^>^>^>^>^>^>^>^>^>^>^>^>^>^>^>^>^>^>^>^>^>^>^>^>^>^>^>^>^>^>^>^>^>^>^>^  -  Vitamin D = 67 - Great  - Please  keep dose same   ^<^<^<^<^<^<^<^<^<^<^<^<^<^<^<^<^<^<^<^<^<^<^<^<^<^<^<^<^<^<^<^<^<^<^<^<^ ^>^>^>^>^>^>^>^>^>^>^>^>^>^>^>^>^>^>^>^>^>^>^>^>^>^>^>^>^>^>^>^>^>^>^>^>^  -  All Else - CBC - Kidneys - Electrolytes - Liver - Magnesium & Thyroid    - all  Normal /  OK  ^<^<^<^<^<^<^<^<^<^<^<^<^<^<^<^<^<^<^<^<^<^<^<^<^<^<^<^<^<^<^<^<^<^<^<^<^ ^>^>^>^>^>^>^>^>^>^>^>^>^>^>^>^>^>^>^>^>^>^>^>^>^>^>^>^>^>^>^>^>^>^>^>^>^

## 2022-08-24 ENCOUNTER — Ambulatory Visit (INDEPENDENT_AMBULATORY_CARE_PROVIDER_SITE_OTHER): Payer: Medicare Other

## 2022-08-24 DIAGNOSIS — I442 Atrioventricular block, complete: Secondary | ICD-10-CM

## 2022-08-24 LAB — CUP PACEART REMOTE DEVICE CHECK
Battery Remaining Longevity: 102 mo
Battery Voltage: 2.99 V
Brady Statistic AP VP Percent: 2.98 %
Brady Statistic AP VS Percent: 0 %
Brady Statistic AS VP Percent: 96.98 %
Brady Statistic AS VS Percent: 0.04 %
Brady Statistic RA Percent Paced: 2.97 %
Brady Statistic RV Percent Paced: 99.96 %
Date Time Interrogation Session: 20240614042919
Implantable Lead Connection Status: 753985
Implantable Lead Connection Status: 753985
Implantable Lead Implant Date: 20200102
Implantable Lead Implant Date: 20200102
Implantable Lead Location: 753859
Implantable Lead Location: 753860
Implantable Lead Model: 4076
Implantable Lead Model: 4076
Implantable Pulse Generator Implant Date: 20200102
Lead Channel Impedance Value: 342 Ohm
Lead Channel Impedance Value: 361 Ohm
Lead Channel Impedance Value: 456 Ohm
Lead Channel Impedance Value: 513 Ohm
Lead Channel Pacing Threshold Amplitude: 0.625 V
Lead Channel Pacing Threshold Amplitude: 0.625 V
Lead Channel Pacing Threshold Pulse Width: 0.4 ms
Lead Channel Pacing Threshold Pulse Width: 0.4 ms
Lead Channel Sensing Intrinsic Amplitude: 14.125 mV
Lead Channel Sensing Intrinsic Amplitude: 14.125 mV
Lead Channel Sensing Intrinsic Amplitude: 2.75 mV
Lead Channel Sensing Intrinsic Amplitude: 2.75 mV
Lead Channel Setting Pacing Amplitude: 1.5 V
Lead Channel Setting Pacing Amplitude: 2 V
Lead Channel Setting Pacing Pulse Width: 0.4 ms
Lead Channel Setting Sensing Sensitivity: 2 mV
Zone Setting Status: 755011

## 2022-09-11 NOTE — Progress Notes (Signed)
Remote pacemaker transmission.   

## 2022-10-12 ENCOUNTER — Ambulatory Visit: Payer: Medicare Other | Admitting: Nurse Practitioner

## 2022-10-14 ENCOUNTER — Other Ambulatory Visit: Payer: Self-pay | Admitting: Nurse Practitioner

## 2022-10-14 DIAGNOSIS — I10 Essential (primary) hypertension: Secondary | ICD-10-CM

## 2022-10-19 NOTE — Progress Notes (Unsigned)
Cardiology Office Note Date:  10/19/2022  Patient ID:  Kristy [name], Kristy [name] [birth year], MRN [id number] PCP:  Kristy Cowboy, MD  Electrophysiologist  Dr. Johney Frame (inactive)    Chief Complaint:  *** annual device visit  History of Present Illness: Kristy [name] is a 77 y.o. female with history of CAD (PCI March 2020), HTN, HLD, CHB w/PPM.  I saw her Jan 2021 She feels well.  Not yet gotten into their place yet unfortunately construction/renovations not yet completed and still with her step son and hi family.  She does not formally exercise but is up/down the stairs numerous times a day, does the shopping, helps around the house, all without symptoms or difficulty.  No CP, palpitations or SOB,no DOE.  No dizzy spells, near syncope or syncope. She is hoping at some point to be able to get off some of the medicines. Crestor was increased and discussed diet.  DAPT continued  She saw Dr. Johney Frame, doing well, plavix was stopped, planned for annual visit.  03/17/20 HDL 66 LDL 63 Trigs 93 LFTs wnl  I saw her 08/10/20 She opens the visit by mentioning that she is very nervous and anxious about today's visit, always tends to be when seeing doctors. She though is doing/feeling well. She does not exercise, but has access to parks and trails very close by her that she hopes to get started walking on. Her husband is 60 and unable to join her which makes it harder to get out and exercise. She cares for the home, no difficulties with her ADLs No CP, SOB, no dizzy spells, near syncope or syncope. Intact device function No changes made  She saw Dr. Johney Frame 10/12/21, doing well, no changes were made, planned for annual visit.  *** symptoms *** CAD, who manages lipids, meds, labs *** dependent *** AM remotes  Device information MDT dual chamber PPM implanted 03/13/2018 (in [place])   Past Medical History:  Diagnosis Date   Abnormal glucose 11/18/2018   Allergy    penicillin   Arteriosclerotic  heart disease (ASHD) 11/18/2018   s/p PCI 05/2018   Cardiac pacemaker 11/18/2018   Cataract    Complete heart block (HCC) 11/18/2018   Essential hypertension 11/18/2018   Hyperlipidemia 2010   Hypertension 2000   Osteoporosis     Past Surgical History:  Procedure Laterality Date   CATARACT EXTRACTION  2023   CHOLECYSTECTOMY  2011   CORONARY ANGIOPLASTY WITH STENT PLACEMENT  05/2018   mohl  2009   face   PACEMAKER IMPLANT  03/2018   MDT pacemaker implanted in [place]   ROTATOR CUFF REPAIR Right 2016    Current Outpatient Medications  Medication Sig Dispense Refill   amLODipine (NORVASC) 5 MG tablet Take 1 tablet (5 mg total) by mouth daily. 90 tablet 3   Ascorbic Acid (VITAMIN C PO) Take 1,000 mg by mouth daily.     aspirin EC 81 MG tablet Take 81 mg by mouth daily.     CHOLECALCIFEROL PO Take 10,000 Units by mouth daily.     hydrocortisone 2.5 % cream PRN     metoprolol succinate (TOPROL-XL) 25 MG 24 hr tablet TAKE 1 TABLET BY MOUTH DAILY 90 tablet 3   rosuvastatin (CRESTOR) 20 MG tablet TAKE 1 TABLET BY MOUTH DAILY 90 tablet 3   zinc gluconate 50 MG tablet Take 50 mg by mouth daily.     No current facility-administered medications for this visit.    Allergies:   Penicillins  and Latex   Social History:  The patient  reports that she has never smoked. She has never used smokeless tobacco. She reports current alcohol use of about 3.0 standard drinks of alcohol per week. She reports that she does not use drugs.   Family History:  The patient's family history includes Colon cancer in her mother; Diabetes in her father; Hypertension in her brother.  ROS:  Please see the history of present illness.  All other systems are reviewed and otherwise negative.   PHYSICAL EXAM:  VS:  There were no vitals taken for this visit. BMI: There is no height or weight on file to calculate BMI. Well nourished, well developed, in no acute distress  HEENT: normocephalic, atraumatic  Neck: no JVD,  carotid bruits or masses Cardiac: *** RRR; no significant murmurs, no rubs, or gallops Lungs:  *** CTA b/l, no wheezing, rhonchi or rales  Abd: soft, nontender, obese MS: no deformity or atrophy Ext: *** no edema  Skin: warm and dry, no rash Neuro:  No gross deficits appreciated Psych: euthymic mood, full affect  *** PPM site is stable, no tethering or discomfort   EKG:  Not done today  PPM interrogation done today and reviewed by myself:  ***    05/19/2018: LHC/PCI (prompted by abnormal stress test) SEE FULL REPORT FOR FULL CORONARY DETAILS LM is large, no significant disease LAD small caliber, mid 99%  >>>PCI with Xience DES LCx large caliber, large OM RCA moderate caliber, dominant, PDA is small caliber          Mid RCA 2 tandem lesions 50-60% Ramus, large bifrucating, no significant obstructive disease   Recent Labs: 08/08/2022: ALT 22; BUN 9; Creat 0.73; Hemoglobin 13.5; Magnesium 2.0; Platelets 251; Potassium 4.4; Sodium 138; TSH 3.29  08/08/2022: Cholesterol 136; HDL 61; LDL Cholesterol (Calc) 53; Total CHOL/HDL Ratio 2.2; Triglycerides 139   CrCl cannot be calculated (Patient's most recent lab result is older than the maximum 21 days allowed.).   Wt Readings from Last 3 Encounters:  08/08/22 154 lb 6.4 oz (70 kg)  05/07/22 156 lb 6.4 oz (70.9 kg)  04/05/22 158 lb (71.7 kg)     Other studies reviewed: Additional studies/records reviewed today include: summarized above  ASSESSMENT AND PLAN:  1. PPM     *** Intact function, no programming changes made     *** Dependent at 40bpm today  2. CAD     PCI to LAD March 2020     *** On ASA, BB, statin     *** No symptoms       3. HTN     ***  4. HLD     ***   Disposition: ***    Current medicines are reviewed at length with the patient today.  The patient did not have any concerns regarding medicines.  Norma Fredrickson, PA-C 10/19/2022 11:45 AM     [facility] [address] [place] [address] [phone] (office)  [phone] (fax)

## 2022-10-22 ENCOUNTER — Ambulatory Visit: Payer: Medicare Other | Attending: Physician Assistant | Admitting: Physician Assistant

## 2022-10-22 ENCOUNTER — Encounter: Payer: Self-pay | Admitting: Physician Assistant

## 2022-10-22 VITALS — BP 138/72 | HR 90 | Ht 62.0 in | Wt 156.6 lb

## 2022-10-22 DIAGNOSIS — I2510 Atherosclerotic heart disease of native coronary artery without angina pectoris: Secondary | ICD-10-CM | POA: Insufficient documentation

## 2022-10-22 DIAGNOSIS — Z950 Presence of cardiac pacemaker: Secondary | ICD-10-CM | POA: Diagnosis present

## 2022-10-22 DIAGNOSIS — I10 Essential (primary) hypertension: Secondary | ICD-10-CM | POA: Insufficient documentation

## 2022-10-22 DIAGNOSIS — I442 Atrioventricular block, complete: Secondary | ICD-10-CM | POA: Diagnosis present

## 2022-10-22 LAB — CUP PACEART INCLINIC DEVICE CHECK
Battery Remaining Longevity: 101 mo
Battery Voltage: 2.99 V
Brady Statistic AP VP Percent: 4.59 %
Brady Statistic AP VS Percent: 0 %
Brady Statistic AS VP Percent: 95.33 %
Brady Statistic AS VS Percent: 0.08 %
Brady Statistic RA Percent Paced: 4.58 %
Brady Statistic RV Percent Paced: 99.92 %
Date Time Interrogation Session: 20240812131813
Implantable Lead Connection Status: 753985
Implantable Lead Connection Status: 753985
Implantable Lead Implant Date: 20200102
Implantable Lead Implant Date: 20200102
Implantable Lead Location: 753859
Implantable Lead Location: 753860
Implantable Lead Model: 4076
Implantable Lead Model: 4076
Implantable Pulse Generator Implant Date: 20200102
Lead Channel Impedance Value: 380 Ohm
Lead Channel Impedance Value: 399 Ohm
Lead Channel Impedance Value: 475 Ohm
Lead Channel Impedance Value: 551 Ohm
Lead Channel Pacing Threshold Amplitude: 0.625 V
Lead Channel Pacing Threshold Amplitude: 0.625 V
Lead Channel Pacing Threshold Pulse Width: 0.4 ms
Lead Channel Pacing Threshold Pulse Width: 0.4 ms
Lead Channel Sensing Intrinsic Amplitude: 14.125 mV
Lead Channel Sensing Intrinsic Amplitude: 14.125 mV
Lead Channel Sensing Intrinsic Amplitude: 2.75 mV
Lead Channel Sensing Intrinsic Amplitude: 3.125 mV
Lead Channel Setting Pacing Amplitude: 1.5 V
Lead Channel Setting Pacing Amplitude: 2 V
Lead Channel Setting Pacing Pulse Width: 0.4 ms
Lead Channel Setting Sensing Sensitivity: 2 mV
Zone Setting Status: 755011

## 2022-10-22 NOTE — Patient Instructions (Addendum)
Medication Instructions:   Your physician recommends that you continue on your current medications as directed. Please refer to the Current Medication list given to you today.    *If you need a refill on your cardiac medications before your next appointment, please call your pharmacy*   Lab Work: NONE ORDERED  TODAY    If you have labs (blood work) drawn today and your tests are completely normal, you will receive your results only by: MyChart Message (if you have MyChart) OR A paper copy in the mail If you have any lab test that is abnormal or we need to change your treatment, we will call you to review the results.   Testing/Procedures: NONE ORDERED  TODAY     Follow-Up: At [facility], you and your health needs are our priority.  As part of our continuing mission to provide you with exceptional heart care, we have created designated Provider Care Teams.  These Care Teams include your primary Cardiologist (physician) and Advanced Practice Providers (APPs -  Physician Assistants and Nurse Practitioners) who all work together to provide you with the care you need, when you need it.  We recommend signing up for the patient portal called "MyChart".  Sign up information is provided on this After Visit Summary.  MyChart is used to connect with patients for Virtual Visits (Telemedicine).  Patients are able to view lab/test results, encounter notes, upcoming appointments, etc.  Non-urgent messages can be sent to your provider as well.   To learn more about what you can do with MyChart, go to [email or web address].    Your next appointment:   1 year(s)  Provider:   Dr. York Pellant   Other Instructions

## 2022-11-13 NOTE — Progress Notes (Signed)
MEDICARE ANNUAL WELLNESS VISIT AND FOLLOW UP  Assessment:   Diagnoses and all orders for this visit:  Encounter for Medicare annual wellness exam Due annually    Complete heart block/ S/p pacemaker Now follows with Dr.Mealor Doing well s/p pacemaker Monitor   Arteriosclerotic heart disease S/p stent 05/2018; completed DAPT x 1 year, now on bASA Denies CP Follows with cardiology Control blood pressure, cholesterol, glucose, encourage lifestyle/exercise   Hypertension Well controlled with current medications Monitor blood pressure at home; patient to call if consistently greater than 130/80 Continue DASH diet.   Reminder to go to the ER if any CP, SOB, nausea, dizziness, severe HA, changes vision/speech, left arm numbness and tingling and jaw pain.  Hyperlipidemia Continue statin therapy for LDL, Rosuvastatin 10 mg  LDL goal of <70 reviewed Continue low cholesterol diet and exercise.  Check lipid panel, CBC, CMP  Abnormal glucse Recent A1Cs at goal Discussed diet/exercise, weight management  Defer A1C; check CMP for serum glucose, monitor weight trends  Overweight - BMI 28 Long discussion about weight loss, diet, and exercise Recommended diet heavy in fruits and veggies and low in animal meats, cheeses, and dairy products, appropriate calorie intake Patient will work on getting more activity Will follow up in 3 months  Vitamin D Def continue to recommend supplementation to maintain goal of 60-100 - Vit D  History of skin cancer Follows regularly with dermatology   Estrogen Def/Osteopenia - DEXA  ordered Continue Vit D and weight bearing exercises   Medication management -     CBC with Differential/Platelet -     COMPLETE METABOLIC PANEL WITH GFR -     Lipid panel -     Magnesium   History of skin cancer Continue to follow with dermatology      Over 40 minutes of exam, counseling, chart review and critical decision making was performed Future  Appointments  Date Time Provider Department Center  11/23/2022  7:00 AM [facility] DEVICE REMOTES [facility]  02/21/2023 10:00 AM [name], NP [facility] None  02/22/2023  7:00 AM [facility] DEVICE REMOTES [facility]  05/24/2023  7:00 AM [facility] DEVICE REMOTES [facility]  08/23/2023  7:00 AM [facility] DEVICE REMOTES [facility]  11/14/2023 11:00 AM [name], NP [facility] None  11/22/2023  7:00 AM [facility] DEVICE REMOTES [facility]  02/21/2024  7:00 AM [facility] DEVICE REMOTES [facility]     Plan:   During the course of the visit the patient was educated and counseled about appropriate screening and preventive services including:   Pneumococcal vaccine  Prevnar 13 Influenza vaccine Td vaccine Screening electrocardiogram Bone densitometry screening Colorectal cancer screening Diabetes screening Glaucoma screening Nutrition counseling  Advanced directives: requested   Subjective:  Kristy [name] is a 77 y.o. female who presents for Medicare Annual Wellness Visit and 3 month follow up.    She is following with [facility] Dr. Roderic Scarce due to history of skin cancer, had moh's of left temple remotely. No recent concerns.   BMI is Body mass index is 28.53 kg/m., she has been working on diet, more smoothies. Minimal exercise.  Wt Readings from Last 3 Encounters:  11/14/22 156 lb (70.8 kg)  10/22/22 156 lb 9.6 oz (71 kg)  08/08/22 154 lb 6.4 oz (70 kg)   Hx of complete heart block in 03/2018 s/p pacemaker and continues to do well; She is now established with Dr. Johney Frame. She has appointment with Dr. Johney Frame tomorrow  She had a (+)  Nuclear stress test early 2020 and underwent cath and had a stent implanted (Mar 2020) and has done well since, completed 1 year of DAPT, now on bASA only. Following with Dr. Nelly Laurence  She has had elevated blood pressure since the 90s.  Her blood pressure has been controlled at home on Amlodipine 5 mg every day , hydrochlorothiazide and Metoprolol 25 mg every day , today their BP is BP: 136/74 . Nervous today being at doctor. Her cardiologist is Dr. Nelly Laurence.  BP Readings from Last 3 Encounters:  11/14/22 136/74  10/22/22 138/72  08/08/22 130/62  She does not workout. She denies chest pain, shortness of breath, dizziness.    She is on cholesterol medication (rosuvastatin 20 mg daily) and denies myalgias. Her cholesterol is at goal. The cholesterol last visit was:   Lab Results  Component Value Date   CHOL 136 08/08/2022   HDL 61 08/08/2022   LDLCALC 53 08/08/2022   TRIG 139 08/08/2022   CHOLHDL 2.2 08/08/2022    She has not been working on diet and exercise for glucose management. Last A1C in the office was:  Lab Results  Component Value Date   HGBA1C 5.9 (H) 08/08/2022   Last GFR: Lab Results  Component Value Date   EGFR 84 08/08/2022    Patient is on Vitamin D supplement.   Lab Results  Component Value Date   VD25OH 67 08/08/2022      Cataracts were done bilaterally in the past year, no complications, 05/2021  L eye and 03/2021 R EYE  Medication Review: Current Outpatient Medications on File Prior to Visit  Medication Sig Dispense Refill   amLODipine (NORVASC) 5 MG tablet Take 1 tablet (5 mg total) by mouth daily. 90 tablet 3   Ascorbic Acid (VITAMIN C PO) Take 1,000 mg by mouth daily.     aspirin EC 81 MG tablet Take 81 mg by mouth daily.     CHOLECALCIFEROL PO Take 10,000 Units by mouth daily.     hydrocortisone 2.5 % cream PRN     metoprolol succinate (TOPROL-XL) 25 MG 24 hr tablet TAKE 1 TABLET BY MOUTH DAILY 90 tablet 3   rosuvastatin (CRESTOR) 20 MG tablet TAKE 1 TABLET BY MOUTH DAILY 90 tablet 3   zinc gluconate 50 MG tablet Take 50 mg by mouth daily.     No current facility-administered medications on file prior to visit.    Allergies  Allergen Reactions   Penicillins    Latex Rash     Current Problems (verified) Patient Active Problem List   Diagnosis Date Noted   CAD (coronary artery disease) 10/12/2021   Osteopenia 10/03/2020   Obesity (BMI 30.0-34.9) 09/01/2019   History of skin cancer 02/23/2019   Arteriosclerotic heart disease (ASHD) 11/18/2018   Cardiac pacemaker 11/18/2018   Complete heart block (HCC) 11/18/2018   Vitamin D deficiency 11/18/2018   Abnormal glucose 11/18/2018   Essential hypertension 11/18/2018   Hyperlipidemia 2010    Screening Tests Immunization History  Administered Date(s) Administered   Fluad Quad(high Dose 65+) 12/30/2018   Influenza, High Dose Seasonal PF 01/12/2020, 12/26/2020   Influenza,inj,quad, With Preservative 01/14/2017   PFIZER(Purple Top)SARS-COV-2 Vaccination 04/17/2019, 05/12/2019, 12/31/2019   Pneumococcal Conjugate-13 04/12/2018   Pneumococcal Polysaccharide-23 09/02/2019   Zoster Recombinant(Shingrix) 02/09/2018, 04/29/2018    Health Maintenance  Topic Date Due   COVID-19 Vaccine (4 - 2023-24 season) 11/30/2022 (Originally 11/11/2022)   INFLUENZA VACCINE  06/10/2023 (Originally 10/11/2022)   Medicare Annual Wellness (AWV)  11/14/2023  Pneumonia Vaccine 38+ Years old  Completed   Hepatitis C Screening  Completed   Zoster Vaccines- Shingrix  Completed   HPV VACCINES  Aged Out   DTaP/Tdap/Td  Discontinued   DEXA SCAN  Discontinued   Colonoscopy  Discontinued  Mammogram 05/10/22  Names of Other Physician/Practitioners you currently use: 1. [facility] here for primary care 2. Dr. Dione Booze, eye doctor, last visit 01/06/22 3. Dr. Shelda Altes, dentist, last visit 11/2021  Patient Care Team: [name], MD as PCP - General (Internal Medicine) Bettey Costa, MD as Consulting Physician (Dermatology)  SURGICAL HISTORY She  has a past surgical history that includes Cholecystectomy (2011); Rotator cuff repair (Right, 2016); mohl (2009); PACEMAKER IMPLANT (03/2018);  Coronary angioplasty with stent (05/2018); and Cataract extraction (2023). FAMILY HISTORY Her family history includes Colon cancer in her mother; Diabetes in her father; Hypertension in her brother. SOCIAL HISTORY She  reports that she has never smoked. She has never used smokeless tobacco. She reports current alcohol use of about 3.0 standard drinks of alcohol per week. She reports that she does not use drugs.   MEDICARE WELLNESS OBJECTIVES: Physical activity:  Housework Cardiac risk factors: Cardiac Risk Factors include: advanced age (>61men, >63 women);dyslipidemia;hypertension;obesity (BMI >30kg/m2);sedentary lifestyle Depression/mood screen:      11/14/2022   11:10 AM  Depression screen PHQ 2/9  Decreased Interest 0  Down, Depressed, Hopeless 0  PHQ - 2 Score 0    ADLs:     11/14/2022   11:08 AM 08/08/2022    9:38 PM  In your present state of health, do you have any difficulty performing the following activities:  Hearing? 0 0  Vision? 0 0  Difficulty concentrating or making decisions? 0 0  Walking or climbing stairs? 1 0  Dressing or bathing? 0 0  Doing errands, shopping? 0 0     Cognitive Testing  Alert? Yes  Normal Appearance?Yes  Oriented to person? Yes  Place? Yes   Time? Yes  Recall of three objects?  Yes  Can perform simple calculations? Yes  Displays appropriate judgment?Yes  Can read the correct time from a watch face?Yes  EOL planning: Does Patient Have a Medical Advance Directive?: Yes Type of Advance Directive: Living will, Healthcare Power of Attorney Does patient want to make changes to medical advance directive?: No - Patient declined Copy of Healthcare Power of Attorney in Chart?: No - copy requested  Review of Systems  Constitutional:  Negative for malaise/fatigue and weight loss.  HENT:  Negative for hearing loss and tinnitus.   Eyes:  Negative for blurred vision and double vision.  Respiratory:  Negative for cough, sputum production, shortness of  breath and wheezing.   Cardiovascular:  Negative for chest pain, palpitations, orthopnea, claudication, leg swelling and PND.  Gastrointestinal:  Negative for abdominal pain, blood in stool, constipation, diarrhea, heartburn, melena, nausea and vomiting.  Genitourinary: Negative.   Musculoskeletal:  Negative for falls, joint pain and myalgias.  Skin:  Negative for rash.  Neurological:  Negative for dizziness, tingling, sensory change, weakness and headaches.  Endo/Heme/Allergies:  Negative for polydipsia.  Psychiatric/Behavioral: Negative.  Negative for depression, memory loss, substance abuse and suicidal ideas. The patient is not nervous/anxious and does not have insomnia.   All other systems reviewed and are negative.    Objective:     Today's Vitals   11/14/22 1054  BP: 136/74  Pulse: 90  Temp: 97.7 F (36.5 C)  SpO2: 98%  Weight: 156 lb (  70.8 kg)  Height: 5\' 2"  (1.575 m)     Body mass index is 28.53 kg/m.  General appearance: alert, no distress, WD/WN, female HEENT: normocephalic, sclerae anicteric, TMs pearly, nares patent, no discharge or erythema, pharynx normal Oral cavity: MMM, no lesions Neck: supple, no lymphadenopathy, no thyromegaly, no masses Heart: RRR, normal S1, S2, no murmurs Lungs: CTA bilaterally, no wheezes, rhonchi, or rales Abdomen: +bs, soft, non tender, non distended, no masses, no hepatomegaly, no splenomegaly Musculoskeletal: nontender, no swelling, no obvious deformity Extremities: no edema, no cyanosis, no clubbing Pulses: 2+ symmetric, upper and lower extremities, normal cap refill Neurological: alert, oriented x 3, CN2-12 intact, strength normal upper extremities and lower extremities, sensation normal throughout, DTRs 2+ throughout, no cerebellar signs, gait normal Psychiatric: normal affect, behavior normal, pleasant   Medicare Attestation I have personally reviewed: The patient's medical and social history Their use of alcohol, tobacco  or illicit drugs Their current medications and supplements The patient's functional ability including ADLs,fall risks, home safety risks, cognitive, and hearing and visual impairment Diet and physical activities Evidence for depression or mood disorders  The patient's weight, height, BMI, and visual acuity have been recorded in the chart.  I have made referrals, counseling, and provided education to the patient based on review of the above and I have provided the patient with a written personalized care plan for preventive services.     [name], NP   11/14/2022

## 2022-11-14 ENCOUNTER — Ambulatory Visit (INDEPENDENT_AMBULATORY_CARE_PROVIDER_SITE_OTHER): Payer: Medicare Other | Admitting: Nurse Practitioner

## 2022-11-14 ENCOUNTER — Encounter: Payer: Self-pay | Admitting: Nurse Practitioner

## 2022-11-14 VITALS — BP 136/74 | HR 90 | Temp 97.7°F | Ht 62.0 in | Wt 156.0 lb

## 2022-11-14 DIAGNOSIS — I2510 Atherosclerotic heart disease of native coronary artery without angina pectoris: Secondary | ICD-10-CM

## 2022-11-14 DIAGNOSIS — Z0000 Encounter for general adult medical examination without abnormal findings: Secondary | ICD-10-CM

## 2022-11-14 DIAGNOSIS — E663 Overweight: Secondary | ICD-10-CM

## 2022-11-14 DIAGNOSIS — R6889 Other general symptoms and signs: Secondary | ICD-10-CM | POA: Diagnosis not present

## 2022-11-14 DIAGNOSIS — Z950 Presence of cardiac pacemaker: Secondary | ICD-10-CM | POA: Diagnosis not present

## 2022-11-14 DIAGNOSIS — Z0001 Encounter for general adult medical examination with abnormal findings: Secondary | ICD-10-CM

## 2022-11-14 DIAGNOSIS — I10 Essential (primary) hypertension: Secondary | ICD-10-CM | POA: Diagnosis not present

## 2022-11-14 DIAGNOSIS — Z85828 Personal history of other malignant neoplasm of skin: Secondary | ICD-10-CM

## 2022-11-14 DIAGNOSIS — R7309 Other abnormal glucose: Secondary | ICD-10-CM

## 2022-11-14 DIAGNOSIS — E559 Vitamin D deficiency, unspecified: Secondary | ICD-10-CM

## 2022-11-14 DIAGNOSIS — E782 Mixed hyperlipidemia: Secondary | ICD-10-CM | POA: Diagnosis not present

## 2022-11-14 DIAGNOSIS — Z79899 Other long term (current) drug therapy: Secondary | ICD-10-CM

## 2022-11-14 DIAGNOSIS — Z6828 Body mass index (BMI) 28.0-28.9, adult: Secondary | ICD-10-CM

## 2022-11-14 DIAGNOSIS — I442 Atrioventricular block, complete: Secondary | ICD-10-CM | POA: Diagnosis not present

## 2022-11-14 DIAGNOSIS — E2839 Other primary ovarian failure: Secondary | ICD-10-CM

## 2022-11-14 DIAGNOSIS — M8580 Other specified disorders of bone density and structure, unspecified site: Secondary | ICD-10-CM

## 2022-11-14 NOTE — Patient Instructions (Signed)

## 2022-11-15 LAB — CBC WITH DIFFERENTIAL/PLATELET
Absolute Monocytes: 699 {cells}/uL (ref 200–950)
Basophils Absolute: 82 {cells}/uL (ref 0–200)
Basophils Relative: 1.3 %
Eosinophils Absolute: 347 {cells}/uL (ref 15–500)
Eosinophils Relative: 5.5 %
HCT: 41.4 % (ref 35.0–45.0)
Hemoglobin: 13.5 g/dL (ref 11.7–15.5)
Lymphs Abs: 1852 {cells}/uL (ref 850–3900)
MCH: 29.7 pg (ref 27.0–33.0)
MCHC: 32.6 g/dL (ref 32.0–36.0)
MCV: 91.2 fL (ref 80.0–100.0)
MPV: 10.3 fL (ref 7.5–12.5)
Monocytes Relative: 11.1 %
Neutro Abs: 3320 {cells}/uL (ref 1500–7800)
Neutrophils Relative %: 52.7 %
Platelets: 254 10*3/uL (ref 140–400)
RBC: 4.54 10*6/uL (ref 3.80–5.10)
RDW: 12.6 % (ref 11.0–15.0)
Total Lymphocyte: 29.4 %
WBC: 6.3 10*3/uL (ref 3.8–10.8)

## 2022-11-15 LAB — LIPID PANEL
Cholesterol: 153 mg/dL (ref <200)
HDL: 64 mg/dL (ref >=50)
LDL Cholesterol (Calc): 67 mg/dL
Non-HDL Cholesterol (Calc): 89 mg/dL (ref <130)
Total CHOL/HDL Ratio: 2.4 (calc) (ref <5.0)
Triglycerides: 137 mg/dL (ref <150)

## 2022-11-15 LAB — COMPLETE METABOLIC PANEL WITH GFR
AG Ratio: 2.1 (calc) (ref 1.0–2.5)
ALT: 20 U/L (ref 6–29)
AST: 21 U/L (ref 10–35)
Albumin: 4.6 g/dL (ref 3.6–5.1)
Alkaline phosphatase (APISO): 64 U/L (ref 37–153)
BUN: 12 mg/dL (ref 7–25)
CO2: 25 mmol/L (ref 20–32)
Calcium: 9.6 mg/dL (ref 8.6–10.4)
Chloride: 101 mmol/L (ref 98–110)
Creat: 0.86 mg/dL (ref 0.60–1.00)
Globulin: 2.2 g/dL (ref 1.9–3.7)
Glucose, Bld: 93 mg/dL (ref 65–99)
Potassium: 4.9 mmol/L (ref 3.5–5.3)
Sodium: 137 mmol/L (ref 135–146)
Total Bilirubin: 0.7 mg/dL (ref 0.2–1.2)
Total Protein: 6.8 g/dL (ref 6.1–8.1)
eGFR: 69 mL/min/{1.73_m2} (ref >=60)

## 2022-11-15 LAB — MAGNESIUM: Magnesium: 2.2 mg/dL (ref 1.5–2.5)

## 2022-11-23 ENCOUNTER — Ambulatory Visit (INDEPENDENT_AMBULATORY_CARE_PROVIDER_SITE_OTHER): Payer: Medicare Other

## 2022-11-23 DIAGNOSIS — I442 Atrioventricular block, complete: Secondary | ICD-10-CM

## 2022-11-23 LAB — CUP PACEART REMOTE DEVICE CHECK
Battery Remaining Longevity: 98 mo
Battery Voltage: 2.99 V
Brady Statistic AP VP Percent: 3.39 %
Brady Statistic AP VS Percent: 0 %
Brady Statistic AS VP Percent: 96.58 %
Brady Statistic AS VS Percent: 0.03 %
Brady Statistic RA Percent Paced: 3.39 %
Brady Statistic RV Percent Paced: 99.97 %
Date Time Interrogation Session: 20240913034212
Implantable Lead Connection Status: 753985
Implantable Lead Connection Status: 753985
Implantable Lead Implant Date: 20200102
Implantable Lead Implant Date: 20200102
Implantable Lead Location: 753859
Implantable Lead Location: 753860
Implantable Lead Model: 4076
Implantable Lead Model: 4076
Implantable Pulse Generator Implant Date: 20200102
Lead Channel Impedance Value: 323 Ohm
Lead Channel Impedance Value: 342 Ohm
Lead Channel Impedance Value: 418 Ohm
Lead Channel Impedance Value: 494 Ohm
Lead Channel Pacing Threshold Amplitude: 0.625 V
Lead Channel Pacing Threshold Amplitude: 0.75 V
Lead Channel Pacing Threshold Pulse Width: 0.4 ms
Lead Channel Pacing Threshold Pulse Width: 0.4 ms
Lead Channel Sensing Intrinsic Amplitude: 14.125 mV
Lead Channel Sensing Intrinsic Amplitude: 14.125 mV
Lead Channel Sensing Intrinsic Amplitude: 2.625 mV
Lead Channel Sensing Intrinsic Amplitude: 2.625 mV
Lead Channel Setting Pacing Amplitude: 1.5 V
Lead Channel Setting Pacing Amplitude: 2 V
Lead Channel Setting Pacing Pulse Width: 0.4 ms
Lead Channel Setting Sensing Sensitivity: 2 mV
Zone Setting Status: 755011

## 2022-11-29 NOTE — Progress Notes (Signed)
Remote pacemaker transmission.   

## 2022-12-31 ENCOUNTER — Other Ambulatory Visit: Payer: Self-pay | Admitting: Internal Medicine

## 2022-12-31 ENCOUNTER — Encounter: Payer: Self-pay | Admitting: Nurse Practitioner

## 2022-12-31 ENCOUNTER — Other Ambulatory Visit: Payer: Self-pay | Admitting: Nurse Practitioner

## 2022-12-31 DIAGNOSIS — E2839 Other primary ovarian failure: Secondary | ICD-10-CM

## 2022-12-31 DIAGNOSIS — M8580 Other specified disorders of bone density and structure, unspecified site: Secondary | ICD-10-CM

## 2022-12-31 DIAGNOSIS — Z1231 Encounter for screening mammogram for malignant neoplasm of breast: Secondary | ICD-10-CM

## 2023-01-28 ENCOUNTER — Encounter: Payer: Medicare Other | Admitting: Internal Medicine

## 2023-02-20 NOTE — Progress Notes (Unsigned)
CPE AND FOLLOW UP  Assessment:   Diagnoses and all orders for this visit:  Encounter for General adult medical examination with abnormal findings Due yearly  Complete heart block/ S/p pacemaker Now follows with KristyMealor Doing well s/p pacemaker Monitor   Arteriosclerotic heart disease S/p stent 05/2018; completed DAPT x 1 year, now on bASA Denies CP Follows with cardiology Control blood pressure, cholesterol, glucose, encourage lifestyle/exercise   Hypertension Well controlled with current medications Monitor blood pressure at home; patient to call if consistently greater than 130/80 Continue DASH diet.   Reminder to go to the ER if any CP, SOB, nausea, dizziness, severe HA, changes vision/speech, left arm numbness and tingling and jaw pain.  Hyperlipidemia Continue statin therapy for LDL, Rosuvastatin 10 mg  LDL goal of <70 reviewed Continue low cholesterol diet and exercise.  Check lipid panel, CBC, CMP  Abnormal glucse Recent A1Cs at goal Discussed diet/exercise, weight management  Defer A1C; check CMP for serum glucose, monitor weight trends  Overweight - BMI 28 Long discussion about weight loss, diet, and exercise Recommended diet heavy in fruits and veggies and low in animal meats, cheeses, and dairy products, appropriate calorie intake Patient will work on getting more activity Will follow up in 3 months  Vitamin D Def continue to recommend supplementation to maintain goal of 60-100   History of skin cancer Follows regularly with dermatology   Estrogen Def/Osteopenia - DEXA  ordered Continue Vit D and weight bearing exercises   Medication management -     CBC with Differential/Platelet -     COMPLETE METABOLIC PANEL WITH GFR -     Lipid panel -    TSH   History of skin cancer Continue to follow with dermatology      Over 40 minutes of exam, counseling, chart review and critical decision making was performed Future Appointments  Date Time  Provider Department Center  02/21/2023 10:00 AM [name], NP [facility] None  02/22/2023  7:00 AM [facility] DEVICE REMOTES [facility]  05/24/2023  7:00 AM [facility] DEVICE REMOTES [facility]  08/08/2023  2:00 PM [facility] MM 2 [facility]  08/08/2023  2:30 PM [facility] DX DEXA 1 [facility]  08/23/2023  7:00 AM [facility] DEVICE REMOTES [facility]  11/14/2023 11:00 AM [name], NP [facility] None  11/22/2023  7:00 AM [facility] DEVICE REMOTES [facility]  02/21/2024  7:00 AM [facility] DEVICE REMOTES [facility]  02/25/2024 10:00 AM Kristy Cowboy, MD [facility] None        Subjective:  Kristy [name] is a 77 y.o. female who presents for CPE and 3 month follow up.    She is following with Kristy [name] Kristy [name] due to history of skin cancer, had moh's of left temple remotely. No recent concerns.   BMI is There is no height or weight on file to calculate BMI., she has been working on diet, more smoothies. Minimal exercise.  Wt Readings from Last 3 Encounters:  11/14/22 156 lb (70.8 kg)  10/22/22 156 lb 9.6 oz (71 kg)  08/08/22 154 lb 6.4 oz (70 kg)   Hx of complete heart block in 03/2018 s/p pacemaker and continues to do well; She is now established with Kristy [name]. She has appointment with Kristy [name] tomorrow  She had a (+) Nuclear stress test early 2020 and underwent cath and had a stent implanted (Mar 2020) and has done well since, completed 1 year of DAPT, now on bASA only. Following  with Kristy [name]  She has had elevated blood pressure since the 90s. Her blood pressure has been controlled at home on Amlodipine 5 mg every day , hydrochlorothiazide and Metoprolol 25 mg every day , today their BP is   . Nervous today being at doctor. Her cardiologist is Kristy [name].  BP Readings from Last 3 Encounters:  11/14/22 136/74  10/22/22 138/72  08/08/22 130/62  She does  not workout. She denies chest pain, shortness of breath, dizziness.    She is on cholesterol medication (rosuvastatin 20 mg daily) and denies myalgias. Her cholesterol is at goal. The cholesterol last visit was:   Lab Results  Component Value Date   CHOL 153 11/14/2022   HDL 64 11/14/2022   LDLCALC 67 11/14/2022   TRIG 137 11/14/2022   CHOLHDL 2.4 11/14/2022    She has not been working on diet and exercise for glucose management. Last A1C in the office was:  Lab Results  Component Value Date   HGBA1C 5.9 (H) 08/08/2022   Last GFR: Lab Results  Component Value Date   EGFR 69 11/14/2022    Patient is on Vitamin D supplement.   Lab Results  Component Value Date   VD25OH 67 08/08/2022      Cataracts were done bilaterally in the past year, no complications, 05/2021  L eye and 03/2021 R EYE  Medication Review: Current Outpatient Medications on File Prior to Visit  Medication Sig Dispense Refill   amLODipine (NORVASC) 5 MG tablet Take 1 tablet (5 mg total) by mouth daily. 90 tablet 3   Ascorbic Acid (VITAMIN C PO) Take 1,000 mg by mouth daily.     aspirin EC 81 MG tablet Take 81 mg by mouth daily.     CHOLECALCIFEROL PO Take 10,000 Units by mouth daily.     hydrocortisone 2.5 % cream PRN     metoprolol succinate (TOPROL-XL) 25 MG 24 hr tablet TAKE 1 TABLET BY MOUTH DAILY 90 tablet 3   rosuvastatin (CRESTOR) 20 MG tablet TAKE 1 TABLET BY MOUTH DAILY 90 tablet 3   zinc gluconate 50 MG tablet Take 50 mg by mouth daily.     No current facility-administered medications on file prior to visit.    Allergies  Allergen Reactions   Penicillins    Latex Rash    Current Problems (verified) Patient Active Problem List   Diagnosis Date Noted   CAD (coronary artery disease) 10/12/2021   Osteopenia 10/03/2020   Obesity (BMI 30.0-34.9) 09/01/2019   History of skin cancer 02/23/2019   Arteriosclerotic heart disease (ASHD) 11/18/2018   Cardiac pacemaker 11/18/2018   Complete heart  block (HCC) 11/18/2018   Vitamin D deficiency 11/18/2018   Abnormal glucose 11/18/2018   Essential hypertension 11/18/2018   Hyperlipidemia 2010    Screening Tests Immunization History  Administered Date(s) Administered   Fluad Quad(high Dose 65+) 12/30/2018   Influenza, High Dose Seasonal PF 01/12/2020, 12/26/2020   Influenza,inj,quad, With Preservative 01/14/2017   PFIZER(Purple Top)SARS-COV-2 Vaccination 04/17/2019, 05/12/2019, 12/31/2019   Pneumococcal Conjugate-13 04/12/2018   Pneumococcal Polysaccharide-23 09/02/2019   Zoster Recombinant(Shingrix) 02/09/2018, 04/29/2018    Health Maintenance  Topic Date Due   COVID-19 Vaccine (4 - 2023-24 season) 11/11/2022   INFLUENZA VACCINE  06/10/2023 (Originally 10/11/2022)   Medicare Annual Wellness (AWV)  11/14/2023   Pneumonia Vaccine 5+ Years old  Completed   Hepatitis C Screening  Completed   Zoster Vaccines- Shingrix  Completed   HPV VACCINES  Aged Out   DTaP/Tdap/Td  Discontinued   DEXA SCAN  Discontinued   Colonoscopy  Discontinued  Mammogram 05/10/22  Names of Other Physician/Practitioners you currently use: 1. [facility] here for primary care 2. Dr. Dione Booze, eye doctor, last visit 01/06/22 3. Dr. Shelda Altes, dentist, last visit 11/2021  Patient Care Team: Kristy Cowboy, MD as PCP - General (Internal Medicine) Bettey Costa, MD as Consulting Physician (Dermatology)  SURGICAL HISTORY She  has a past surgical history that includes Cholecystectomy (2011); Rotator cuff repair (Right, 2016); mohl (2009); PACEMAKER IMPLANT (03/2018); Coronary angioplasty with stent (05/2018); and Cataract extraction (2023). FAMILY HISTORY Her family history includes Colon cancer in her mother; Diabetes in her father; Hypertension in her brother. SOCIAL HISTORY She  reports that she has never smoked. She has never used smokeless tobacco. She reports current alcohol use of about 3.0 standard drinks of  alcohol per week. She reports that she does not use drugs.     Review of Systems  Constitutional:  Negative for malaise/fatigue and weight loss.  HENT:  Negative for hearing loss and tinnitus.   Eyes:  Negative for blurred vision and double vision.  Respiratory:  Negative for cough, sputum production, shortness of breath and wheezing.   Cardiovascular:  Negative for chest pain, palpitations, orthopnea, claudication, leg swelling and PND.  Gastrointestinal:  Negative for abdominal pain, blood in stool, constipation, diarrhea, heartburn, melena, nausea and vomiting.  Genitourinary: Negative.   Musculoskeletal:  Negative for falls, joint pain and myalgias.  Skin:  Negative for rash.  Neurological:  Negative for dizziness, tingling, sensory change, weakness and headaches.  Endo/Heme/Allergies:  Negative for polydipsia.  Psychiatric/Behavioral: Negative.  Negative for depression, memory loss, substance abuse and suicidal ideas. The patient is not nervous/anxious and does not have insomnia.   All other systems reviewed and are negative.    Objective:     There were no vitals filed for this visit.    There is no height or weight on file to calculate BMI.  General appearance: alert, no distress, WD/WN, female HEENT: normocephalic, sclerae anicteric, TMs pearly, nares patent, no discharge or erythema, pharynx normal Oral cavity: MMM, no lesions Neck: supple, no lymphadenopathy, no thyromegaly, no masses Heart: RRR, normal S1, S2, no murmurs Lungs: CTA bilaterally, no wheezes, rhonchi, or rales Abdomen: +bs, soft, non tender, non distended, no masses, no hepatomegaly, no splenomegaly Musculoskeletal: nontender, no swelling, no obvious deformity Extremities: no edema, no cyanosis, no clubbing Pulses: 2+ symmetric, upper and lower extremities, normal cap refill Neurological: alert, oriented x 3, CN2-12 intact, strength normal upper extremities and lower extremities, sensation normal  throughout, DTRs 2+ throughout, no cerebellar signs, gait normal Psychiatric: normal affect, behavior normal, pleasant  EKG: defer to cardiology    [name], NP   02/20/2023

## 2023-02-21 ENCOUNTER — Encounter: Payer: Medicare Other | Admitting: Nurse Practitioner

## 2023-02-22 ENCOUNTER — Ambulatory Visit: Payer: Medicare Other

## 2023-02-22 DIAGNOSIS — I442 Atrioventricular block, complete: Secondary | ICD-10-CM | POA: Diagnosis not present

## 2023-02-23 LAB — CUP PACEART REMOTE DEVICE CHECK
Battery Remaining Longevity: 95 mo
Battery Voltage: 2.99 V
Brady Statistic AP VP Percent: 3.57 %
Brady Statistic AP VS Percent: 0 %
Brady Statistic AS VP Percent: 96.38 %
Brady Statistic AS VS Percent: 0.06 %
Brady Statistic RA Percent Paced: 3.58 %
Brady Statistic RV Percent Paced: 99.94 %
Date Time Interrogation Session: 20241212225711
Implantable Lead Connection Status: 753985
Implantable Lead Connection Status: 753985
Implantable Lead Implant Date: 20200102
Implantable Lead Implant Date: 20200102
Implantable Lead Location: 753859
Implantable Lead Location: 753860
Implantable Lead Model: 4076
Implantable Lead Model: 4076
Implantable Pulse Generator Implant Date: 20200102
Lead Channel Impedance Value: 323 Ohm
Lead Channel Impedance Value: 342 Ohm
Lead Channel Impedance Value: 418 Ohm
Lead Channel Impedance Value: 475 Ohm
Lead Channel Pacing Threshold Amplitude: 0.625 V
Lead Channel Pacing Threshold Amplitude: 0.75 V
Lead Channel Pacing Threshold Pulse Width: 0.4 ms
Lead Channel Pacing Threshold Pulse Width: 0.4 ms
Lead Channel Sensing Intrinsic Amplitude: 14.125 mV
Lead Channel Sensing Intrinsic Amplitude: 14.125 mV
Lead Channel Sensing Intrinsic Amplitude: 2.625 mV
Lead Channel Sensing Intrinsic Amplitude: 2.625 mV
Lead Channel Setting Pacing Amplitude: 1.5 V
Lead Channel Setting Pacing Amplitude: 2 V
Lead Channel Setting Pacing Pulse Width: 0.4 ms
Lead Channel Setting Sensing Sensitivity: 2 mV
Zone Setting Status: 755011

## 2023-03-15 NOTE — Progress Notes (Deleted)
 CPE AND FOLLOW UP  Assessment:   Diagnoses and all orders for this visit:  Encounter for General adult medical examination with abnormal findings Due yearly  Complete heart block/ S/p pacemaker Now follows with Dr.Mealor Doing well s/p pacemaker Monitor   Arteriosclerotic heart disease S/p stent 05/2018; completed DAPT x 1 year, now on bASA Denies CP Follows with cardiology Control blood pressure, cholesterol, glucose, encourage lifestyle/exercise   Hypertension Well controlled with current medications Monitor blood pressure at home; patient to call if consistently greater than 130/80 Continue DASH diet.   Reminder to go to the ER if any CP, SOB, nausea, dizziness, severe HA, changes vision/speech, left arm numbness and tingling and jaw pain.  Hyperlipidemia Continue statin therapy for LDL, Rosuvastatin  10 mg  LDL goal of <70 reviewed Continue low cholesterol diet and exercise.  Check lipid panel, CBC, CMP  Abnormal glucse Recent A1Cs at goal Discussed diet/exercise, weight management  Defer A1C; check CMP for serum glucose, monitor weight trends  Overweight - BMI 28 Long discussion about weight loss, diet, and exercise Recommended diet heavy in fruits and veggies and low in animal meats, cheeses, and dairy products, appropriate calorie intake Patient will work on getting more activity Will follow up in 3 months  Vitamin D  Def continue to recommend supplementation to maintain goal of 60-100   History of skin cancer Follows regularly with dermatology   Estrogen Def/Osteopenia - DEXA  ordered Continue Vit D and weight bearing exercises   Medication management -     CBC with Differential/Platelet -     COMPLETE METABOLIC PANEL WITH GFR -     Lipid panel -    TSH   History of skin cancer Continue to follow with dermatology      Over 40 minutes of exam, counseling, chart review and critical decision making was performed Future Appointments  Date Time  Provider Department Center  03/18/2023 10:00 AM Jude Lonell BRAVO, NP [facility] None  05/24/2023  7:00 AM [facility] DEVICE REMOTES [facility]  08/08/2023  2:00 PM [facility] MM 2 [facility]  08/08/2023  2:30 PM [facility] DX DEXA 1 [facility]  08/23/2023  7:00 AM [facility] DEVICE REMOTES [facility]  11/14/2023 11:00 AM Jude Lonell BRAVO, NP [facility] None  11/22/2023  7:00 AM [facility] DEVICE REMOTES [facility]  02/21/2024  7:00 AM [facility] DEVICE REMOTES [facility]  03/27/2024 10:00 AM Tonita Fallow, MD [facility] None        Subjective:  Kristy [name] is a 78 y.o. female who presents for CPE and 3 month follow up.    She is following with [facility] Dr. Bonney due to history of skin cancer, had moh's of left temple remotely. No recent concerns.   BMI is There is no height or weight on file to calculate BMI., she has been working on diet, more smoothies. Minimal exercise.  Wt Readings from Last 3 Encounters:  11/14/22 156 lb (70.8 kg)  10/22/22 156 lb 9.6 oz (71 kg)  08/08/22 154 lb 6.4 oz (70 kg)   Hx of complete heart block in 03/2018 s/p pacemaker and continues to do well; She is now established with Dr. Kelsie. She has appointment with Dr. Kelsie tomorrow  She had a (+) Nuclear stress test early 2020 and underwent cath and had a stent implanted (Mar 2020) and has done well since, completed 1 year of DAPT, now on bASA only. Following with Dr. Nancey  She has had elevated blood pressure  since the 90s. Her blood pressure has been controlled at home on Amlodipine  5 mg every day , hydrochlorothiazide and Metoprolol  25 mg every day , today their BP is   . Nervous today being at doctor. Her cardiologist is Dr. Nancey.  BP Readings from Last 3 Encounters:  11/14/22 136/74  10/22/22 138/72  08/08/22 130/62  She does not workout. She denies chest pain, shortness of breath, dizziness.    She  is on cholesterol medication (rosuvastatin  20 mg daily) and denies myalgias. Her cholesterol is at goal. The cholesterol last visit was:   Lab Results  Component Value Date   CHOL 153 11/14/2022   HDL 64 11/14/2022   LDLCALC 67 11/14/2022   TRIG 137 11/14/2022   CHOLHDL 2.4 11/14/2022    She has not been working on diet and exercise for glucose management. Last A1C in the office was:  Lab Results  Component Value Date   HGBA1C 5.9 (H) 08/08/2022   Last GFR: Lab Results  Component Value Date   EGFR 69 11/14/2022    Patient is on Vitamin D  supplement.   Lab Results  Component Value Date   VD25OH 67 08/08/2022      Cataracts were done bilaterally in the past year, no complications, 05/2021  L eye and 03/2021 R EYE  Medication Review: Current Outpatient Medications on File Prior to Visit  Medication Sig Dispense Refill   amLODipine  (NORVASC ) 5 MG tablet Take 1 tablet (5 mg total) by mouth daily. 90 tablet 3   Ascorbic Acid (VITAMIN C PO) Take 1,000 mg by mouth daily.     aspirin EC 81 MG tablet Take 81 mg by mouth daily.     CHOLECALCIFEROL PO Take 10,000 Units by mouth daily.     hydrocortisone 2.5 % cream PRN     metoprolol  succinate (TOPROL -XL) 25 MG 24 hr tablet TAKE 1 TABLET BY MOUTH DAILY 90 tablet 3   rosuvastatin  (CRESTOR ) 20 MG tablet TAKE 1 TABLET BY MOUTH DAILY 90 tablet 3   zinc gluconate 50 MG tablet Take 50 mg by mouth daily.     No current facility-administered medications on file prior to visit.    Allergies  Allergen Reactions   Penicillins    Latex Rash    Current Problems (verified) Patient Active Problem List   Diagnosis Date Noted   CAD (coronary artery disease) 10/12/2021   Osteopenia 10/03/2020   Obesity (BMI 30.0-34.9) 09/01/2019   History of skin cancer 02/23/2019   Arteriosclerotic heart disease (ASHD) 11/18/2018   Cardiac pacemaker 11/18/2018   Complete heart block (HCC) 11/18/2018   Vitamin D  deficiency 11/18/2018   Abnormal glucose  11/18/2018   Essential hypertension 11/18/2018   Hyperlipidemia 2010    Screening Tests Immunization History  Administered Date(s) Administered   Fluad Quad(high Dose 65+) 12/30/2018   Influenza, High Dose Seasonal PF 01/12/2020, 12/26/2020   Influenza,inj,quad, With Preservative 01/14/2017   PFIZER(Purple Top)SARS-COV-2 Vaccination 04/17/2019, 05/12/2019, 12/31/2019   Pneumococcal Conjugate-13 04/12/2018   Pneumococcal Polysaccharide-23 09/02/2019   Zoster Recombinant(Shingrix) 02/09/2018, 04/29/2018    Health Maintenance  Topic Date Due   COVID-19 Vaccine (4 - 2024-25 season) 11/11/2022   INFLUENZA VACCINE  06/10/2023 (Originally 10/11/2022)   Medicare Annual Wellness (AWV)  11/14/2023   Pneumonia Vaccine 51+ Years old  Completed   Hepatitis C Screening  Completed   Zoster Vaccines- Shingrix  Completed   HPV VACCINES  Aged Out   DTaP/Tdap/Td  Discontinued   DEXA SCAN  Discontinued  Colonoscopy  Discontinued  Mammogram 05/10/22  Names of Other Physician/Practitioners you currently use: 1. [facility] here for primary care 2. Dr. Octavia, eye doctor, last visit 01/06/22 3. Dr. Sharalyn, dentist, last visit 11/2021  Patient Care Team: Tonita Fallow, MD as PCP - General (Internal Medicine) Bonney Melanie SAUNDERS, MD as Consulting Physician (Dermatology)  SURGICAL HISTORY She  has a past surgical history that includes Cholecystectomy (2011); Rotator cuff repair (Right, 2016); mohl (2009); PACEMAKER IMPLANT (03/2018); Coronary angioplasty with stent (05/2018); and Cataract extraction (2023). FAMILY HISTORY Her family history includes Colon cancer in her mother; Diabetes in her father; Hypertension in her brother. SOCIAL HISTORY She  reports that she has never smoked. She has never used smokeless tobacco. She reports current alcohol use of about 3.0 standard drinks of alcohol per week. She reports that she does not use drugs.     Review of  Systems  Constitutional:  Negative for malaise/fatigue and weight loss.  HENT:  Negative for hearing loss and tinnitus.   Eyes:  Negative for blurred vision and double vision.  Respiratory:  Negative for cough, sputum production, shortness of breath and wheezing.   Cardiovascular:  Negative for chest pain, palpitations, orthopnea, claudication, leg swelling and PND.  Gastrointestinal:  Negative for abdominal pain, blood in stool, constipation, diarrhea, heartburn, melena, nausea and vomiting.  Genitourinary: Negative.   Musculoskeletal:  Negative for falls, joint pain and myalgias.  Skin:  Negative for rash.  Neurological:  Negative for dizziness, tingling, sensory change, weakness and headaches.  Endo/Heme/Allergies:  Negative for polydipsia.  Psychiatric/Behavioral: Negative.  Negative for depression, memory loss, substance abuse and suicidal ideas. The patient is not nervous/anxious and does not have insomnia.   All other systems reviewed and are negative.    Objective:     There were no vitals filed for this visit.    There is no height or weight on file to calculate BMI.  General appearance: alert, no distress, WD/WN, female HEENT: normocephalic, sclerae anicteric, TMs pearly, nares patent, no discharge or erythema, pharynx normal Oral cavity: MMM, no lesions Neck: supple, no lymphadenopathy, no thyromegaly, no masses Heart: RRR, normal S1, S2, no murmurs Lungs: CTA bilaterally, no wheezes, rhonchi, or rales Abdomen: +bs, soft, non tender, non distended, no masses, no hepatomegaly, no splenomegaly Musculoskeletal: nontender, no swelling, no obvious deformity Extremities: no edema, no cyanosis, no clubbing Pulses: 2+ symmetric, upper and lower extremities, normal cap refill Neurological: alert, oriented x 3, CN2-12 intact, strength normal upper extremities and lower extremities, sensation normal throughout, DTRs 2+ throughout, no cerebellar signs, gait normal Psychiatric:  normal affect, behavior normal, pleasant  EKG: defer to cardiology    LONELL FORBES [place], NP   03/15/2023

## 2023-03-18 ENCOUNTER — Encounter: Payer: Medicare Other | Admitting: Nurse Practitioner

## 2023-03-18 DIAGNOSIS — Z950 Presence of cardiac pacemaker: Secondary | ICD-10-CM

## 2023-03-18 DIAGNOSIS — I10 Essential (primary) hypertension: Secondary | ICD-10-CM

## 2023-03-18 DIAGNOSIS — Z0001 Encounter for general adult medical examination with abnormal findings: Secondary | ICD-10-CM

## 2023-03-18 DIAGNOSIS — Z79899 Other long term (current) drug therapy: Secondary | ICD-10-CM

## 2023-03-18 DIAGNOSIS — M8580 Other specified disorders of bone density and structure, unspecified site: Secondary | ICD-10-CM

## 2023-03-18 DIAGNOSIS — I2510 Atherosclerotic heart disease of native coronary artery without angina pectoris: Secondary | ICD-10-CM

## 2023-03-18 DIAGNOSIS — R7309 Other abnormal glucose: Secondary | ICD-10-CM

## 2023-03-18 DIAGNOSIS — I442 Atrioventricular block, complete: Secondary | ICD-10-CM

## 2023-03-18 DIAGNOSIS — E663 Overweight: Secondary | ICD-10-CM

## 2023-03-18 DIAGNOSIS — E559 Vitamin D deficiency, unspecified: Secondary | ICD-10-CM

## 2023-03-18 DIAGNOSIS — E782 Mixed hyperlipidemia: Secondary | ICD-10-CM

## 2023-03-18 DIAGNOSIS — Z85828 Personal history of other malignant neoplasm of skin: Secondary | ICD-10-CM

## 2023-03-18 DIAGNOSIS — E2839 Other primary ovarian failure: Secondary | ICD-10-CM

## 2023-03-28 NOTE — Progress Notes (Signed)
Remote pacemaker transmission.   

## 2023-03-28 NOTE — Addendum Note (Signed)
Addended by: Elease Etienne A on: 03/28/2023 05:38 PM   Modules accepted: Orders

## 2023-04-08 ENCOUNTER — Encounter: Payer: Medicare Other | Admitting: Nurse Practitioner

## 2023-05-01 ENCOUNTER — Encounter: Payer: Medicare Other | Admitting: Nurse Practitioner

## 2023-05-21 ENCOUNTER — Other Ambulatory Visit: Payer: Self-pay | Admitting: Internal Medicine

## 2023-05-21 DIAGNOSIS — M8580 Other specified disorders of bone density and structure, unspecified site: Secondary | ICD-10-CM

## 2023-05-21 DIAGNOSIS — E2839 Other primary ovarian failure: Secondary | ICD-10-CM

## 2023-05-21 DIAGNOSIS — Z1231 Encounter for screening mammogram for malignant neoplasm of breast: Secondary | ICD-10-CM

## 2023-05-24 ENCOUNTER — Ambulatory Visit (INDEPENDENT_AMBULATORY_CARE_PROVIDER_SITE_OTHER): Payer: Medicare Other

## 2023-05-24 DIAGNOSIS — I442 Atrioventricular block, complete: Secondary | ICD-10-CM | POA: Diagnosis not present

## 2023-05-24 LAB — CUP PACEART REMOTE DEVICE CHECK
Battery Remaining Longevity: 91 mo
Battery Voltage: 2.98 V
Brady Statistic AP VP Percent: 4.11 %
Brady Statistic AP VS Percent: 0 %
Brady Statistic AS VP Percent: 95.83 %
Brady Statistic AS VS Percent: 0.06 %
Brady Statistic RA Percent Paced: 4.12 %
Brady Statistic RV Percent Paced: 99.94 %
Date Time Interrogation Session: 20250314010900
Implantable Lead Connection Status: 753985
Implantable Lead Connection Status: 753985
Implantable Lead Implant Date: 20200102
Implantable Lead Implant Date: 20200102
Implantable Lead Location: 753859
Implantable Lead Location: 753860
Implantable Lead Model: 4076
Implantable Lead Model: 4076
Implantable Pulse Generator Implant Date: 20200102
Lead Channel Impedance Value: 342 Ohm
Lead Channel Impedance Value: 361 Ohm
Lead Channel Impedance Value: 418 Ohm
Lead Channel Impedance Value: 475 Ohm
Lead Channel Pacing Threshold Amplitude: 0.5 V
Lead Channel Pacing Threshold Amplitude: 0.75 V
Lead Channel Pacing Threshold Pulse Width: 0.4 ms
Lead Channel Pacing Threshold Pulse Width: 0.4 ms
Lead Channel Sensing Intrinsic Amplitude: 14.125 mV
Lead Channel Sensing Intrinsic Amplitude: 14.125 mV
Lead Channel Sensing Intrinsic Amplitude: 3 mV
Lead Channel Sensing Intrinsic Amplitude: 3 mV
Lead Channel Setting Pacing Amplitude: 1.5 V
Lead Channel Setting Pacing Amplitude: 2 V
Lead Channel Setting Pacing Pulse Width: 0.4 ms
Lead Channel Setting Sensing Sensitivity: 2 mV
Zone Setting Status: 755011

## 2023-06-03 ENCOUNTER — Encounter: Payer: Self-pay | Admitting: Cardiovascular Disease

## 2023-06-26 NOTE — Progress Notes (Signed)
 Remote pacemaker transmission.

## 2023-08-08 ENCOUNTER — Ambulatory Visit: Payer: Medicare Other

## 2023-08-08 ENCOUNTER — Ambulatory Visit
Admission: RE | Admit: 2023-08-08 | Discharge: 2023-08-08 | Disposition: A | Discharge status: Home / Self Care | Payer: Medicare Other | Source: Ambulatory Visit | Attending: Internal Medicine | Admitting: Internal Medicine

## 2023-08-08 ENCOUNTER — Ambulatory Visit
Admission: RE | Admit: 2023-08-08 | Discharge: 2023-08-08 | Disposition: A | Discharge status: Home / Self Care | Payer: Medicare Other | Source: Ambulatory Visit | Attending: Nurse Practitioner | Admitting: Nurse Practitioner

## 2023-08-08 DIAGNOSIS — E2839 Other primary ovarian failure: Secondary | ICD-10-CM

## 2023-08-08 DIAGNOSIS — M8580 Other specified disorders of bone density and structure, unspecified site: Secondary | ICD-10-CM

## 2023-08-08 DIAGNOSIS — Z1231 Encounter for screening mammogram for malignant neoplasm of breast: Secondary | ICD-10-CM

## 2023-08-23 ENCOUNTER — Ambulatory Visit: Payer: Medicare Other

## 2023-08-23 DIAGNOSIS — I442 Atrioventricular block, complete: Secondary | ICD-10-CM

## 2023-08-23 LAB — CUP PACEART REMOTE DEVICE CHECK
Battery Remaining Longevity: 88 mo
Battery Voltage: 2.98 V
Brady Statistic AP VP Percent: 3.44 %
Brady Statistic AP VS Percent: 0 %
Brady Statistic AS VP Percent: 96.55 %
Brady Statistic AS VS Percent: 0.01 %
Brady Statistic RA Percent Paced: 3.42 %
Brady Statistic RV Percent Paced: 99.99 %
Date Time Interrogation Session: 20250612235125
Implantable Lead Connection Status: 753985
Implantable Lead Connection Status: 753985
Implantable Lead Implant Date: 20200102
Implantable Lead Implant Date: 20200102
Implantable Lead Location: 753859
Implantable Lead Location: 753860
Implantable Lead Model: 4076
Implantable Lead Model: 4076
Implantable Pulse Generator Implant Date: 20200102
Lead Channel Impedance Value: 342 Ohm
Lead Channel Impedance Value: 361 Ohm
Lead Channel Impedance Value: 399 Ohm
Lead Channel Impedance Value: 475 Ohm
Lead Channel Pacing Threshold Amplitude: 0.5 V
Lead Channel Pacing Threshold Amplitude: 0.625 V
Lead Channel Pacing Threshold Pulse Width: 0.4 ms
Lead Channel Pacing Threshold Pulse Width: 0.4 ms
Lead Channel Sensing Intrinsic Amplitude: 14.125 mV
Lead Channel Sensing Intrinsic Amplitude: 14.125 mV
Lead Channel Sensing Intrinsic Amplitude: 2.125 mV
Lead Channel Sensing Intrinsic Amplitude: 2.125 mV
Lead Channel Setting Pacing Amplitude: 1.5 V
Lead Channel Setting Pacing Amplitude: 2 V
Lead Channel Setting Pacing Pulse Width: 0.4 ms
Lead Channel Setting Sensing Sensitivity: 2 mV
Zone Setting Status: 755011

## 2023-08-24 ENCOUNTER — Ambulatory Visit: Payer: Self-pay | Admitting: Cardiovascular Disease

## 2023-10-10 NOTE — Progress Notes (Signed)
 Remote pacemaker transmission.

## 2023-11-14 ENCOUNTER — Ambulatory Visit: Payer: Medicare Other | Admitting: Nurse Practitioner

## 2023-11-22 ENCOUNTER — Ambulatory Visit (INDEPENDENT_AMBULATORY_CARE_PROVIDER_SITE_OTHER): Payer: Medicare Other

## 2023-11-22 DIAGNOSIS — I442 Atrioventricular block, complete: Secondary | ICD-10-CM | POA: Diagnosis not present

## 2023-11-25 LAB — CUP PACEART REMOTE DEVICE CHECK
Battery Remaining Longevity: 85 mo
Battery Voltage: 2.98 V
Brady Statistic AP VP Percent: 3.72 %
Brady Statistic AP VS Percent: 0 %
Brady Statistic AS VP Percent: 96.27 %
Brady Statistic AS VS Percent: 0.02 %
Brady Statistic RA Percent Paced: 3.7 %
Brady Statistic RV Percent Paced: 99.98 %
Date Time Interrogation Session: 20250911234542
Implantable Lead Connection Status: 753985
Implantable Lead Connection Status: 753985
Implantable Lead Implant Date: 20200102
Implantable Lead Implant Date: 20200102
Implantable Lead Location: 753859
Implantable Lead Location: 753860
Implantable Lead Model: 4076
Implantable Lead Model: 4076
Implantable Pulse Generator Implant Date: 20200102
Lead Channel Impedance Value: 342 Ohm
Lead Channel Impedance Value: 361 Ohm
Lead Channel Impedance Value: 475 Ohm
Lead Channel Impedance Value: 532 Ohm
Lead Channel Pacing Threshold Amplitude: 0.5 V
Lead Channel Pacing Threshold Amplitude: 0.625 V
Lead Channel Pacing Threshold Pulse Width: 0.4 ms
Lead Channel Pacing Threshold Pulse Width: 0.4 ms
Lead Channel Sensing Intrinsic Amplitude: 2.375 mV
Lead Channel Sensing Intrinsic Amplitude: 2.375 mV
Lead Channel Sensing Intrinsic Amplitude: 8.375 mV
Lead Channel Sensing Intrinsic Amplitude: 8.375 mV
Lead Channel Setting Pacing Amplitude: 1.5 V
Lead Channel Setting Pacing Amplitude: 2 V
Lead Channel Setting Pacing Pulse Width: 0.4 ms
Lead Channel Setting Sensing Sensitivity: 2 mV
Zone Setting Status: 755011

## 2023-11-29 NOTE — Progress Notes (Signed)
 Remote PPM Transmission

## 2023-11-30 ENCOUNTER — Ambulatory Visit: Payer: Self-pay | Admitting: Cardiovascular Disease

## 2024-02-21 ENCOUNTER — Ambulatory Visit: Payer: Medicare Other

## 2024-02-21 DIAGNOSIS — I442 Atrioventricular block, complete: Secondary | ICD-10-CM

## 2024-02-22 LAB — CUP PACEART REMOTE DEVICE CHECK
Battery Remaining Longevity: 80 mo
Battery Voltage: 2.97 V
Brady Statistic AP VP Percent: 1.05 %
Brady Statistic AP VS Percent: 0 %
Brady Statistic AS VP Percent: 98.92 %
Brady Statistic AS VS Percent: 0.03 %
Brady Statistic RA Percent Paced: 1.04 %
Brady Statistic RV Percent Paced: 99.97 %
Date Time Interrogation Session: 20251211235701
Implantable Lead Connection Status: 753985
Implantable Lead Connection Status: 753985
Implantable Lead Implant Date: 20200102
Implantable Lead Implant Date: 20200102
Implantable Lead Location: 753859
Implantable Lead Location: 753860
Implantable Lead Model: 4076
Implantable Lead Model: 4076
Implantable Pulse Generator Implant Date: 20200102
Lead Channel Impedance Value: 323 Ohm
Lead Channel Impedance Value: 342 Ohm
Lead Channel Impedance Value: 418 Ohm
Lead Channel Impedance Value: 475 Ohm
Lead Channel Pacing Threshold Amplitude: 0.5 V
Lead Channel Pacing Threshold Amplitude: 0.75 V
Lead Channel Pacing Threshold Pulse Width: 0.4 ms
Lead Channel Pacing Threshold Pulse Width: 0.4 ms
Lead Channel Sensing Intrinsic Amplitude: 2.25 mV
Lead Channel Sensing Intrinsic Amplitude: 2.25 mV
Lead Channel Sensing Intrinsic Amplitude: 8.375 mV
Lead Channel Sensing Intrinsic Amplitude: 8.375 mV
Lead Channel Setting Pacing Amplitude: 1.5 V
Lead Channel Setting Pacing Amplitude: 2 V
Lead Channel Setting Pacing Pulse Width: 0.4 ms
Lead Channel Setting Sensing Sensitivity: 2 mV
Zone Setting Status: 755011

## 2024-02-25 ENCOUNTER — Encounter: Payer: Medicare Other | Admitting: Internal Medicine

## 2024-02-28 NOTE — Progress Notes (Signed)
 Remote PPM Transmission

## 2024-03-09 ENCOUNTER — Ambulatory Visit: Payer: Self-pay | Admitting: Cardiovascular Disease

## 2024-03-27 ENCOUNTER — Encounter: Payer: Medicare Other | Admitting: Internal Medicine

## 2024-05-22 ENCOUNTER — Ambulatory Visit

## 2024-08-21 ENCOUNTER — Ambulatory Visit

## 2024-11-20 ENCOUNTER — Ambulatory Visit

## 2025-02-19 ENCOUNTER — Ambulatory Visit

## 2025-05-21 ENCOUNTER — Ambulatory Visit
